# Patient Record
Sex: Male | Born: 1955 | Race: White | Hispanic: No | Smoking: Never smoker
Health system: Southern US, Community
[De-identification: ages and names within clinical notes are randomized; demographics above are authoritative.]

## PROBLEM LIST (undated history)

## (undated) DIAGNOSIS — M1990 Unspecified osteoarthritis, unspecified site: Secondary | ICD-10-CM

## (undated) DIAGNOSIS — R001 Bradycardia, unspecified: Secondary | ICD-10-CM

## (undated) DIAGNOSIS — I4891 Unspecified atrial fibrillation: Secondary | ICD-10-CM

## (undated) DIAGNOSIS — I10 Essential (primary) hypertension: Secondary | ICD-10-CM

## (undated) DIAGNOSIS — F329 Major depressive disorder, single episode, unspecified: Secondary | ICD-10-CM

## (undated) DIAGNOSIS — E785 Hyperlipidemia, unspecified: Secondary | ICD-10-CM

## (undated) DIAGNOSIS — G8929 Other chronic pain: Secondary | ICD-10-CM

## (undated) DIAGNOSIS — M549 Dorsalgia, unspecified: Secondary | ICD-10-CM

## (undated) DIAGNOSIS — R5383 Other fatigue: Secondary | ICD-10-CM

## (undated) DIAGNOSIS — F32A Depression, unspecified: Secondary | ICD-10-CM

## (undated) DIAGNOSIS — I2510 Atherosclerotic heart disease of native coronary artery without angina pectoris: Secondary | ICD-10-CM

## (undated) DIAGNOSIS — N529 Male erectile dysfunction, unspecified: Secondary | ICD-10-CM

## (undated) HISTORY — DX: Atherosclerotic heart disease of native coronary artery without angina pectoris: I25.10

## (undated) HISTORY — DX: Unspecified atrial fibrillation: I48.91

## (undated) HISTORY — PX: JOINT REPLACEMENT: SHX530

## (undated) HISTORY — DX: Other fatigue: R53.83

## (undated) HISTORY — DX: Hyperlipidemia, unspecified: E78.5

## (undated) HISTORY — DX: Unspecified osteoarthritis, unspecified site: M19.90

---

## 2001-07-09 ENCOUNTER — Encounter: Admission: RE | Admit: 2001-07-09 | Discharge: 2001-07-09 | Payer: Self-pay | Admitting: *Deleted

## 2001-07-09 ENCOUNTER — Encounter: Payer: Self-pay | Admitting: *Deleted

## 2002-03-22 ENCOUNTER — Emergency Department (HOSPITAL_COMMUNITY): Admission: EM | Admit: 2002-03-22 | Discharge: 2002-03-22 | Payer: Self-pay | Admitting: Emergency Medicine

## 2002-03-22 ENCOUNTER — Encounter: Payer: Self-pay | Admitting: Emergency Medicine

## 2002-03-31 ENCOUNTER — Ambulatory Visit (HOSPITAL_BASED_OUTPATIENT_CLINIC_OR_DEPARTMENT_OTHER): Admission: RE | Admit: 2002-03-31 | Discharge: 2002-03-31 | Payer: Self-pay | Admitting: Urology

## 2005-12-24 HISTORY — PX: TOTAL HIP ARTHROPLASTY: SHX124

## 2006-01-08 ENCOUNTER — Inpatient Hospital Stay (HOSPITAL_COMMUNITY): Admission: RE | Admit: 2006-01-08 | Discharge: 2006-01-12 | Payer: Self-pay | Admitting: Orthopedic Surgery

## 2006-01-28 ENCOUNTER — Encounter: Payer: Self-pay | Admitting: Orthopedic Surgery

## 2006-02-23 ENCOUNTER — Encounter: Payer: Self-pay | Admitting: Orthopedic Surgery

## 2006-12-24 ENCOUNTER — Emergency Department: Payer: Self-pay | Admitting: Emergency Medicine

## 2008-04-18 HISTORY — PX: CARDIAC CATHETERIZATION: SHX172

## 2008-04-19 HISTORY — PX: CORONARY ARTERY BYPASS GRAFT: SHX141

## 2008-05-10 ENCOUNTER — Encounter: Payer: Self-pay | Admitting: Internal Medicine

## 2008-05-26 ENCOUNTER — Encounter: Payer: Self-pay | Admitting: Internal Medicine

## 2008-06-17 ENCOUNTER — Inpatient Hospital Stay: Payer: Self-pay | Admitting: Internal Medicine

## 2008-06-26 ENCOUNTER — Encounter: Payer: Self-pay | Admitting: Internal Medicine

## 2009-10-08 ENCOUNTER — Encounter: Payer: Self-pay | Admitting: Pulmonary Disease

## 2009-11-27 ENCOUNTER — Encounter: Payer: Self-pay | Admitting: Pulmonary Disease

## 2009-12-14 DIAGNOSIS — M1990 Unspecified osteoarthritis, unspecified site: Secondary | ICD-10-CM | POA: Insufficient documentation

## 2009-12-17 ENCOUNTER — Ambulatory Visit: Payer: Self-pay | Admitting: Pulmonary Disease

## 2009-12-17 DIAGNOSIS — R5383 Other fatigue: Secondary | ICD-10-CM

## 2009-12-17 DIAGNOSIS — Z96649 Presence of unspecified artificial hip joint: Secondary | ICD-10-CM | POA: Insufficient documentation

## 2009-12-17 DIAGNOSIS — R5381 Other malaise: Secondary | ICD-10-CM | POA: Insufficient documentation

## 2009-12-18 ENCOUNTER — Telehealth (INDEPENDENT_AMBULATORY_CARE_PROVIDER_SITE_OTHER): Payer: Self-pay | Admitting: *Deleted

## 2009-12-26 ENCOUNTER — Encounter: Payer: Self-pay | Admitting: Pulmonary Disease

## 2010-02-11 ENCOUNTER — Telehealth: Payer: Self-pay | Admitting: Pulmonary Disease

## 2010-06-25 NOTE — Assessment & Plan Note (Signed)
Summary: sleep consult/apc   Visit Type:  Initial Consult Copy to:  Dr. Deborah Chalk Primary Provider/Referring Provider:  Guerry Bruin, MD  CC:  Sleep consult.  History of Present Illness: 54/M for evaluation of excessive daytime fatigue. He feels that he does not sleep long enough, sometimes laying for a long time, tired, no energy. Epworth Sleepiness Score is 11/24. he naps x 30 mins during lunchtime & after work. Weekend naps are refreshing. Wife has not witnessed apneas or complained about his snoring - she has noted whole body 'quivering' in his sleep. bedtime is 8 30 p, latency 15 mins, 4-5 spontaneous awakenings, sleep son his side with 2 pillows, wakes up at 5A feeling refreshed, no dryness. he drinks 4-5 glasses of tea & 2-3 cans of soda daily. There is no history suggestive of cataplexy, sleep paralysis or parasomnias . Cardiac evaluation by D rTennant incl treadmill stress test was nml. I am not sure if he has had thyroid studies done with his PMD>  Preventive Screening-Counseling & Management  Alcohol-Tobacco     Smoking Status: never   History of Present Illness: Don't sleep long at a time without waking up  What time do you typically go to bed?(between what hours): 8:30pm  How long does it take you to fall asleep? not long maybe 15 minutes  How many times during the night do you wake up? 4-5 times a night  What time do you get out of bed to start your day? 5:00am  Do you drive or operate heavy machinery in your occupation? no  How much has your weight changed (up or down) over the past two years? (in pounds): none  Have you ever had a sleep study before?  If yes,when and where: no  Do you currently use CPAP ? If so , at what pressure? no  Do you wear oxygen at any time? If yes, how many liters per minute? no Allergies (verified): No Known Drug Allergies  Past History:  Past Surgical History: Heart bypass-Apr 18, 2008 HIP REPLACEMENT, LEFT, HX OF  (ICD-V43.64) OSTEOARTHRITIS (ICD-715.90) Left elbow surgery  Family History: Pulmonary edema--mother Heart disease-brother  Social History: Married Non-Smoker Occupation--Works in heating and air-conditioning Patient never smoked.  Smoking Status:  never  Review of Systems       The patient complains of shortness of breath with activity, depression, and joint stiffness or pain.  The patient denies shortness of breath at rest, productive cough, non-productive cough, coughing up blood, chest pain, irregular heartbeats, acid heartburn, indigestion, loss of appetite, weight change, abdominal pain, difficulty swallowing, sore throat, tooth/dental problems, headaches, nasal congestion/difficulty breathing through nose, sneezing, itching, ear ache, anxiety, hand/feet swelling, rash, change in color of mucus, and fever.    Vital Signs:  Patient profile:   55 year old male Height:      68 inches Weight:      194.8 pounds BMI:     29.73 O2 Sat:      93 % on Room air Temp:     98.3 degrees F oral Pulse rate:   72 / minute BP sitting:   130 / 78  (left arm) Cuff size:   regular  Vitals Entered By: Zackery Barefoot CMA (December 17, 2009 2:59 PM)  O2 Flow:  Room air CC: Sleep consult Comments Medications reviewed with patient Verified contact number and pharmacy with patient Zackery Barefoot CMA  December 17, 2009 3:03 PM    Physical Exam  Additional Exam:  Gen. Pleasant, well-nourished,  in no distress, normal affect ENT - no lesions, no post nasal drip, class 2 airway Neck: No JVD, no thyromegaly, no carotid bruits Lungs: no use of accessory muscles, no dullness to percussion, clear without rales or rhonchi  Cardiovascular: Rhythm regular, heart sounds  normal, no murmurs or gallops, no peripheral edema Abdomen: soft and non-tender, no hepatosplenomegaly, BS normal. Musculoskeletal: No deformities, no cyanosis or clubbing Neuro:  alert, non focal     Impression &  Recommendations:  Problem # 1:  FATIGUE (ICD-780.79) Given no clear explanation for fatigue, it is resaonable to look at sleep architecture & see if there is sleep disordered beathing or leg movements hampering sleep quality. The pathophysiology of obstructive sleep apnea, it's cardiovascular consequences and modes of treatment including CPAP were discussed with the patient in great detail.   Orders: Sleep Disorder Referral (Sleep Disorder) Consultation Level III (09811)  Medications Added to Medication List This Visit: 1)  Aspirin 325 Mg Tabs (Aspirin) .... Take 1 tablet by mouth every morning  Patient Instructions: 1)  Please schedule a follow-up appointment in 2 weeks after study 2)  Obtain blood work from dr [employer] History:  Influenza Immunization History:    Influenza:  historical (02/26/2009)     Appended Document: sleep consult/apc d/w Dr Zoila Shutter (peer to peer) - pl let him know that insurance company will not approve sleep study  Appended Document: sleep consult/apc Left several messages on cell phone # that sleep study had been denied by his ins. company. Pt also got several copies of the denial letter from ins. Dr. Vassie Loll did peer to peer, however, still denied. Sleep Study cancelled at the lab. Left message once more today on pt's cell phone that study has been cancelled due to ins. denying service.

## 2010-06-25 NOTE — Progress Notes (Signed)
  Phone Note Other Incoming   Request: Send information Summary of Call: Records received from [facility]. 7 pages forwarded to Dr. Vassie Loll for review.

## 2010-06-25 NOTE — Progress Notes (Signed)
Summary: nos appt  Phone Note Call from Patient   Caller: [email or web address]  Call For: alva Summary of Call: LMTCB x2 to rsc nos from 9/16. Initial call taken by: [name],  February 11, 2010 4:00 PM

## 2010-06-25 NOTE — Letter (Signed)
Summary: [facility]  [facility]   Imported By: Sherian Rein 12/21/2009 13:21:04  _____________________________________________________________________  External Attachment:    Type:   Image     Comment:   External Document

## 2010-09-28 ENCOUNTER — Ambulatory Visit: Payer: Self-pay | Admitting: Family Medicine

## 2010-10-11 NOTE — Op Note (Signed)
Jared [name], Jared [name]                   ACCOUNT NO.:  [id number]   MEDICAL RECORD NO.:  [id number]          PATIENT TYPE:  INP   LOCATION:  2899                         FACILITY:  [facility]   PHYSICIAN:  Nadara Mustard, MD     DATE OF BIRTH:  1956/02/28   DATE OF PROCEDURE:  01/08/2006  DATE OF DISCHARGE:                                 OPERATIVE REPORT   PREOPERATIVE DIAGNOSIS:  Osteoarthritis of left hip, status post femoral  neck fracture.   POSTOPERATIVE DIAGNOSIS:  Osteoarthritis of left hip, status post femoral  neck fracture.   PROCEDURE:  1. Removal of retained hardware, Knowles pin.  2. Left total hip arthroplasty with DePuy components, metal-on-metal      implants, with 51-mm head, 58-mm acetabulum, +2 neck with #12 stem.   SURGEON:  Nadara Mustard, MD   ANESTHESIA:  General.   ESTIMATED BLOOD LOSS:  Minimal.   ANTIBIOTICS:  One gram of Kefzol.   DRAINS:  None.   COMPLICATIONS:  None.   DISPOSITION:  To PACU in stable condition.   INDICATION FOR PROCEDURE:  The patient is a 55 year old gentleman who is  status post a femoral neck fracture for which he underwent percutaneous pin  fixation.  The patient developed progressive osteoarthritis and is unable to  perform activities of daily living due to left hip pain and presents at this  time for total hip arthroplasty.  Risks and benefits were discussed  including infection, neurovascular injury, persistent pain, dislocation,  DVT, pulmonary, need for additional surgery and fracture of the bone.  The  patient states he understands and wishes to proceed at this time.   DESCRIPTION OF PROCEDURE:  The patient was brought to OR room #1 and  underwent a general anesthetic.  After adequate level of anesthesia was  obtained, the patient was placed in the right lateral decubitus position  with the left side up and the left lower extremity was prepped using  DuraPrep, draped into a sterile field and Ioban was used to cover all  exposed skin.  A posterolateral incision was made; this was carried down to  the tensor fascia lata, which was split.  The Knowles pin was identified and  removed with the box wrench.  Attention was then focused on the femur.  The  piriformis and short external rotators were tagged, cut and retracted.  The  capsule was incised longitudinally off the neck and a suture was placed to  repair the capsule.  The head was dislocated and the femoral neck cut was  made 1 cm proximal to the calcar in line with the template.  Attention was  then focused on the acetabulum.  The large osteophytic bone spurs were  removed from the acetabulum.  The patient was noted to have large loose  bodies within the acetabulum, which were also removed.  The acetabular was  sequentially reamed to 57 and a 58-mm acetabulum was then inserted with 45  degrees of abduction and approximately 20 degrees of anteversion.  The  attention was then focused on the femur.  The  femur was sequentially  broached to a size 12.  The hip was reduced.  The distal aspect of the  gluteus maximus had to be released to facilitate reduction due to the  chronic shortened nature of the hip.  The anterior aspect of the capsule was  also released.  The hip was reduced.  There was full flexion and full  abduction, internal rotation of greater than 70 degrees and the hip was  stable.  He had full extension and external rotation with no instability.  The trial components were removed.  The final #12 stem was inserted with the  51-mm head.  This was then reduced, again placed through a full range of  motion and was stable.  The wound was irrigated with normal saline  throughout the case.  The capsule, piriformis and short external rotators  were reapproximated using #1 Ethibond.  The sciatic nerve was protected  throughout the case and there were 2 branches of the sciatic nerve within  the operative field and these were evaluated both pre and  postoperatively  and these were both intact.  There was no tension on the sciatic nerve.  There was no injury and no hematoma.  The tensor fascia lata was then closed  using a #1 Vicryl.  Subcu was closed in 2-0 Vicryl.  The skin was closed  using approximating staples.  The leg length appeared equal.  The wound was  covered Adaptic, orthopedic sponges, ABD dressing and Hypafix tape.  The  patient was placed in a knee immobilizer, extubated and taken to the PACU in  stable condition.  He did have an active dorsiflexion of the ankle and toes.   Plan for hospitalization with discharge to home.      Nadara Mustard, MD  Electronically Signed     MVD/MEDQ  D:  01/08/2006  T:  01/08/2006  Job:  [id number]

## 2010-10-11 NOTE — Discharge Summary (Signed)
NAMESIMON, LLAMAS                   ACCOUNT NO.:  [id number]   MEDICAL RECORD NO.:  [id number]          PATIENT TYPE:  INP   LOCATION:  5033                         FACILITY:  [facility]   PHYSICIAN:  Nadara Mustard, MD     DATE OF BIRTH:  Dec 01, 1955   DATE OF ADMISSION:  01/08/2006  DATE OF DISCHARGE:  01/12/2006                                 DISCHARGE SUMMARY   FINAL DIAGNOSES:  1. Osteoarthritis left hip status post femoral neck fracture.   PROCEDURES:  1. Removal of retained hardware.  2. Left total hip arthroplasty.  Discharged to home in stable condition.      Plan to follow-up in 2 weeks.  Prescription for doxycycline, Coumadin      and Tylox, as well as for home health therapy.   HISTORY OF PRESENT ILLNESS:  The patient is a 55 year old gentleman who fell  in the 1980s sustaining an open reduction internal fixation for a femoral  neck fracture.  The patient has developed progressive arthritis, and despite  conservative care, he is unable to perform activities of daily living and  wishes to proceed with total hip arthroplasty at this time.  The patient  underwent a total hip arthroplasty on August 16 with removal of retained  hardware and placement of a metal on metal total hip arthroplasty.  He  received a 51 mm head 58 mm acetabulum +2 neck size 12 stem.  He received  Kefzol for infection prophylaxis and Coumadin for DVT prophylaxis.  The  patient progressed well with physical therapy.  He had some mild redness  around the incision and was started on doxycycline.  He was discharged to  home in stable condition on August 20 with followup in the office in 2 weeks  with home health physical therapy and prescription for doxycycline, Coumadin  and Tylox.      Nadara Mustard, MD  Electronically Signed     MVD/MEDQ  D:  01/28/2006  T:  01/28/2006  Job:  [id number]

## 2010-10-31 ENCOUNTER — Encounter: Payer: Self-pay | Admitting: *Deleted

## 2010-11-04 ENCOUNTER — Ambulatory Visit (INDEPENDENT_AMBULATORY_CARE_PROVIDER_SITE_OTHER): Payer: No Typology Code available for payment source | Admitting: Cardiology

## 2010-11-04 ENCOUNTER — Encounter: Payer: Self-pay | Admitting: Cardiology

## 2010-11-04 VITALS — BP 116/58 | HR 60 | Ht 68.0 in | Wt 196.5 lb

## 2010-11-04 DIAGNOSIS — I2510 Atherosclerotic heart disease of native coronary artery without angina pectoris: Secondary | ICD-10-CM | POA: Insufficient documentation

## 2010-11-04 NOTE — Progress Notes (Signed)
Subjective:   Jared [name] is seen back today for followup visit. In general, he's feeling well except for lower extremity aching and weakness. He doesn't have much in way of low back pain. He has had a total hip replacement that is currently under recall. He did not have a sleep study. He complains of aching and discomfort in his lower extremities. I suggested that we try to stop his simvastatin for 6 weeks and then come and see Jared [name] for a followup visit, lipids, and appropriate course of action dependent upon clinical findings.  Current Outpatient Prescriptions  Medication Sig Dispense Refill  . aspirin 81 MG tablet Take 325 mg by mouth daily.       . clonazePAM (KLONOPIN) 0.5 MG tablet Take 0.5 mg by mouth as needed.        . metoprolol (LOPRESSOR) 50 MG tablet Take 50 mg by mouth 2 (two) times daily.        . sertraline (ZOLOFT) 50 MG tablet Take 50 mg by mouth daily.        . simvastatin (ZOCOR) 40 MG tablet Take 40 mg by mouth at bedtime.          No Known Allergies  Patient Active Problem List  Diagnoses  . OSTEOARTHRITIS  . FATIGUE  . HIP REPLACEMENT, LEFT, HX OF    History  Smoking status  . Never Smoker   Smokeless tobacco  . Never Used    History  Alcohol Use No    Family History  Problem Relation Age of Onset  . Edema Mother     pulmonary edema    Review of Systems:   The patient denies any heat or cold intolerance.  No weight gain or weight loss.  The patient denies headaches or blurry vision.  There is no cough or sputum production.  The patient denies dizziness.  There is no hematuria or hematochezia.  The patient denies any muscle aches or arthritis.  The patient denies any rash.  The patient denies frequent falling or instability.  There is no history of depression or anxiety.  All other systems were reviewed and are negative.   Physical Exam:   Weight is 196. Blood pressure 116/58 sitting, heart rate 60.The head is normocephalic and atraumatic.  Pupils are  equally round and reactive to light.  Sclerae nonicteric.  Conjunctiva is clear.  Oropharynx is unremarkable.  There's adequate oral airway.  Neck is supple there are no masses.  Thyroid is not enlarged.  There is no lymphadenopathy.  Lungs are clear.  Chest is symmetric.  Heart shows a regular rate and rhythm.  S1 and S2 are normal.  There is no murmur click or gallop.  Abdomen is soft normal bowel sounds.  There is no organomegaly.  Genital and rectal deferred.  Extremities are without edema.  Peripheral pulses are adequate.  Neurologically intact.  Full range of motion.  The patient is not depressed.  Skin is warm and dry.  He does have good pedal pulse Assessment / Plan:

## 2010-11-04 NOTE — Assessment & Plan Note (Signed)
He had coronary artery bypass grafting on April 18, 2008 at [facility] in [place]. He had a left internal mammary artery to the LAD and a saphenous vein graft to diagonal and has done well since that time. He remains on low-dose beta blockers and doesn't have any side effects. He continues taking aspirin. He is on simvastatin and may be having myalgias and we will give him a drug holiday. He will see Lawson Fiscal in 6 weeks.

## 2010-11-08 ENCOUNTER — Other Ambulatory Visit: Payer: Self-pay | Admitting: Cardiology

## 2010-12-03 ENCOUNTER — Other Ambulatory Visit: Payer: Self-pay | Admitting: *Deleted

## 2010-12-03 DIAGNOSIS — E785 Hyperlipidemia, unspecified: Secondary | ICD-10-CM

## 2010-12-13 ENCOUNTER — Encounter: Payer: Self-pay | Admitting: Cardiology

## 2010-12-16 ENCOUNTER — Other Ambulatory Visit: Payer: No Typology Code available for payment source | Admitting: *Deleted

## 2010-12-16 ENCOUNTER — Ambulatory Visit: Payer: No Typology Code available for payment source | Admitting: Nurse Practitioner

## 2010-12-26 ENCOUNTER — Other Ambulatory Visit (INDEPENDENT_AMBULATORY_CARE_PROVIDER_SITE_OTHER): Payer: No Typology Code available for payment source | Admitting: *Deleted

## 2010-12-26 ENCOUNTER — Encounter: Payer: Self-pay | Admitting: Nurse Practitioner

## 2010-12-26 ENCOUNTER — Ambulatory Visit (INDEPENDENT_AMBULATORY_CARE_PROVIDER_SITE_OTHER): Payer: No Typology Code available for payment source | Admitting: Nurse Practitioner

## 2010-12-26 ENCOUNTER — Other Ambulatory Visit: Payer: Self-pay | Admitting: Nurse Practitioner

## 2010-12-26 VITALS — BP 132/90 | HR 56 | Ht 68.0 in | Wt 199.4 lb

## 2010-12-26 DIAGNOSIS — I2510 Atherosclerotic heart disease of native coronary artery without angina pectoris: Secondary | ICD-10-CM

## 2010-12-26 DIAGNOSIS — E785 Hyperlipidemia, unspecified: Secondary | ICD-10-CM

## 2010-12-26 LAB — BASIC METABOLIC PANEL
BUN: 19 mg/dL (ref 6–23)
CO2: 26 mEq/L (ref 19–32)
Calcium: 9.2 mg/dL (ref 8.4–10.5)
Chloride: 104 mEq/L (ref 96–112)
Creatinine, Ser: 0.9 mg/dL (ref 0.4–1.5)
GFR: 98.73 mL/min (ref >60.00)
Glucose, Bld: 116 mg/dL — ABNORMAL HIGH (ref 70–99)
Potassium: 4.8 mEq/L (ref 3.5–5.1)
Sodium: 142 mEq/L (ref 135–145)

## 2010-12-26 LAB — HEPATIC FUNCTION PANEL
ALT: 22 U/L (ref 0–53)
AST: 22 U/L (ref 0–37)
Albumin: 4.7 g/dL (ref 3.5–5.2)
Alkaline Phosphatase: 56 U/L (ref 39–117)
Bilirubin, Direct: 0.1 mg/dL (ref 0.0–0.3)
Total Bilirubin: 0.7 mg/dL (ref 0.3–1.2)
Total Protein: 7.9 g/dL (ref 6.0–8.3)

## 2010-12-26 LAB — LDL CHOLESTEROL, DIRECT: Direct LDL: 95.1 mg/dL

## 2010-12-26 LAB — LIPID PANEL
Cholesterol: 194 mg/dL (ref 0–200)
HDL: 59.7 mg/dL (ref >39.00)
Total CHOL/HDL Ratio: 3
Triglycerides: 217 mg/dL — ABNORMAL HIGH (ref 0.0–149.0)
VLDL: 43.4 mg/dL — ABNORMAL HIGH (ref 0.0–40.0)

## 2010-12-26 MED ORDER — ROSUVASTATIN CALCIUM 5 MG PO TABS: 5.0000 mg | ORAL_TABLET | Freq: Every day | ORAL | Status: DC

## 2010-12-26 NOTE — Assessment & Plan Note (Signed)
Now on Crestor 5 mg. Samples and coupon card given today. Will check labs today.

## 2010-12-26 NOTE — Patient Instructions (Signed)
We will leave you on your current medicines We are going to check your cholesterol levels today We will see you in 6 months. You will see Dr. Delane Ginger at that time. Call for any problems.

## 2010-12-26 NOTE — Progress Notes (Signed)
Jared [name] Date of Birth: 1955-07-05   History of Present Illness: Jared [name] is seen today for a 6 week check. He is seen for Dr. Elease Hashimoto. He is a former patient of Dr. Ronnald Nian. He has had prior CABG. He is doing well with no chest pain. The heat has been hard on him. He works IT consultant units. He is staying active. Blood pressure is lower at home. He has not had his medicines today. His Zocor was stopped due to myalgias. He is now on Crestor and feels ok. He will need labs today.   Current Outpatient Prescriptions on File Prior to Visit  Medication Sig Dispense Refill  . clonazePAM (KLONOPIN) 0.5 MG tablet Take 0.5 mg by mouth as needed.        . fish oil-omega-3 fatty acids 1000 MG capsule Take 2 g by mouth daily.        . metoprolol (LOPRESSOR) 50 MG tablet TAKE 1 TABLET BY MOUTH TWICE A DAY  60 tablet  6  . naproxen sodium (ANAPROX) 220 MG tablet Take 220 mg by mouth 2 (two) times daily with a meal.        . rosuvastatin (CRESTOR) 5 MG tablet Take 1 tablet (5 mg total) by mouth daily.  90 tablet  3  . sertraline (ZOLOFT) 50 MG tablet Take 50 mg by mouth daily.        Marland Kitchen DISCONTD: aspirin 81 MG tablet Take 325 mg by mouth daily.         Allergies  Allergen Reactions  . Zocor (Simvastatin)     myalgias    Past Medical History  Diagnosis Date  . Coronary artery disease     CABG -- x2 -- 04/19/2008 -- EF of 70%left internal mammary artery to the LAD and a saphenous vein graft to the first diagonal with apparent intermediate coronary that was felt to be too small to bypass -- By Dr. Leotis Pain  . Fatigue   . Hyperlipidemia   . Hypercholesteremia     Myalgias with Zocor  . Osteoarthritis     Past Surgical History  Procedure Date  . Coronary artery bypass graft 04/19/2008    x2 -- EF of 70% -- left internal mammary artery to the LAD and a saphenous vein graft to the first diagonal with apparent intermediate coronary that was felt to be too small to bypass  .  Cardiac catheterization 04/18/2008    severe stenosis in the left anterior descending and what was described at that point as the first and second diagonal vessels.  The right coronary artery was nondominant .  The left circumfles had what was felt to be moderate sesions in its midportions  . Total hip arthroplasty     History  Smoking status  . Never Smoker   Smokeless tobacco  . Never Used    History  Alcohol Use No    Family History  Problem Relation Age of Onset  . Edema Mother     pulmonary edema    Review of Systems: The review of systems is positive for mild fatigue.  All other systems were reviewed and are negative.  Physical Exam: BP 132/90  Pulse 56  Ht 5\' 8"  (1.727 m)  Wt 199 lb 6.4 oz (90.447 kg)  BMI 30.32 kg/m2 (no meds today) Patient is very pleasant and in no acute distress. Skin is warm and dry. Color is normal.  HEENT is unremarkable. Normocephalic/atraumatic. PERRL. Sclera are  nonicteric. Neck is supple. No masses. No JVD. Lungs are clear. Cardiac exam shows a regular rate and rhythm. Abdomen is soft. Extremities are without edema. Gait and ROM are intact. No gross neurologic deficits noted.  LABORATORY DATA:   Assessment / Plan:

## 2010-12-26 NOTE — Assessment & Plan Note (Signed)
Prior CABG in 2009. Doing well from our standpoint. He will see Dr. Elease Hashimoto on return in 6 months. Will consider nuclear study on return. Patient is agreeable to this plan and will call if any problems develop in the interim.

## 2010-12-27 NOTE — Progress Notes (Signed)
Pt called, msg to call bvack with questions

## 2010-12-27 NOTE — Progress Notes (Signed)
Patient called with lab results. msg left to call for questions,

## 2011-08-22 ENCOUNTER — Other Ambulatory Visit: Payer: Self-pay | Admitting: Nurse Practitioner

## 2012-01-03 ENCOUNTER — Other Ambulatory Visit: Payer: Self-pay | Admitting: Nurse Practitioner

## 2012-01-31 ENCOUNTER — Other Ambulatory Visit: Payer: Self-pay | Admitting: Nurse Practitioner

## 2012-06-03 ENCOUNTER — Other Ambulatory Visit: Payer: Self-pay | Admitting: Nurse Practitioner

## 2012-07-07 ENCOUNTER — Other Ambulatory Visit: Payer: Self-pay | Admitting: Nurse Practitioner

## 2012-08-12 ENCOUNTER — Other Ambulatory Visit: Payer: Self-pay | Admitting: Nurse Practitioner

## 2012-08-22 ENCOUNTER — Other Ambulatory Visit: Payer: Self-pay | Admitting: Nurse Practitioner

## 2012-08-30 ENCOUNTER — Other Ambulatory Visit: Payer: Self-pay | Admitting: Nurse Practitioner

## 2012-10-09 ENCOUNTER — Other Ambulatory Visit: Payer: Self-pay | Admitting: Nurse Practitioner

## 2012-10-22 ENCOUNTER — Ambulatory Visit: Payer: No Typology Code available for payment source | Admitting: Nurse Practitioner

## 2012-11-23 ENCOUNTER — Encounter: Payer: Self-pay | Admitting: Nurse Practitioner

## 2012-11-23 ENCOUNTER — Ambulatory Visit (INDEPENDENT_AMBULATORY_CARE_PROVIDER_SITE_OTHER): Payer: BC Managed Care – PPO | Admitting: Nurse Practitioner

## 2012-11-23 VITALS — BP 130/76 | HR 60 | Ht 67.0 in | Wt 204.8 lb

## 2012-11-23 DIAGNOSIS — I2510 Atherosclerotic heart disease of native coronary artery without angina pectoris: Secondary | ICD-10-CM

## 2012-11-23 MED ORDER — ROSUVASTATIN CALCIUM 5 MG PO TABS: 5.0000 mg | ORAL_TABLET | Freq: Every day | ORAL | Status: DC

## 2012-11-23 NOTE — Progress Notes (Signed)
Decklan R Hilliker Date of Birth: 1956/03/07 Medical Record #[id number]  History of Present Illness: Mr. Westbrooks is seen today for a follow up visit. He is seen for Dr. Elease Hashimoto. He is a former patient of DR. Tennant's. Has known CAD with prior CABG in 2009 with LIMA to LAD and SVG to DX. The intermediate was felt to be too small to bypass. Other issues include HTN and HLD.   Last seen almost 2 years ago - he was doing well.   Comes in today. He is here with his wife. Doing well. 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 No chest pain. Not short of breath. Not dizzy or lightheaded. Tries to stay active. Feels ok on his medicines. Sees Dr. Wylene Simmer for his primary care and has a physical later this summer with labs. No complaints. Needs Crestor refilled.   Current Outpatient Prescriptions  Medication Sig Dispense Refill  . aspirin 325 MG tablet Take 325 mg by mouth daily.        Marland Kitchen CIALIS 5 MG tablet Take 5 mg by mouth as needed.       . clonazePAM (KLONOPIN) 0.5 MG tablet Take 0.5 mg by mouth as needed.        . fish oil-omega-3 fatty acids 1000 MG capsule Take 2 g by mouth daily.        . metoprolol (LOPRESSOR) 50 MG tablet TAKE 1 TABLET BY MOUTH TWICE A DAY  60 tablet  4  . naproxen sodium (ANAPROX) 220 MG tablet Take 220 mg by mouth 2 (two) times daily with a meal.        . rosuvastatin (CRESTOR) 5 MG tablet Take 1 tablet (5 mg total) by mouth daily.  30 tablet  11  . sertraline (ZOLOFT) 50 MG tablet Take 50 mg by mouth daily.         No current facility-administered medications for this visit.    Allergies  Allergen Reactions  . Zocor (Simvastatin)     myalgias    Past Medical History  Diagnosis Date  . Coronary artery disease     CABG -- x2 -- 04/19/2008 -- EF of 70%left internal mammary artery to the LAD and a saphenous vein graft to the first diagonal with apparent intermediate coronary that was felt to be too small to bypass -- By Dr. Leotis Pain  . Fatigue   .  Hyperlipidemia   . Hypercholesteremia     Myalgias with Zocor  . Osteoarthritis     Past Surgical History  Procedure Laterality Date  . Coronary artery bypass graft  04/19/2008    x2 -- EF of 70% -- left internal mammary artery to the LAD and a saphenous vein graft to the first diagonal with apparent intermediate coronary that was felt to be too small to bypass  . Cardiac catheterization  04/18/2008    severe stenosis in the left anterior descending and what was described at that point as the first and second diagonal vessels.  The right coronary artery was nondominant .  The left circumfles had what was felt to be moderate sesions in its midportions  . Total hip arthroplasty      History  Smoking status  . Never Smoker   Smokeless tobacco  . Never Used    History  Alcohol Use No    Family History  Problem Relation Age of Onset  . Edema Mother     pulmonary edema    Review of Systems: The  review of systems is per the HPI.  All other systems were reviewed and are negative.  Physical Exam: BP 130/76  Pulse 60  Ht 5\' 7"  (1.702 m)  Wt 204 lb 12.8 oz (92.897 kg)  BMI 32.07 kg/m2 Patient is very pleasant and in no acute distress. Skin is warm and dry. Color is normal.  HEENT is unremarkable. Normocephalic/atraumatic. PERRL. Sclera are nonicteric. Neck is supple. No masses. No JVD. Lungs are clear. Cardiac exam shows a regular rate and rhythm. Abdomen is soft. Extremities are without edema. Gait and ROM are intact. No gross neurologic deficits noted.  LABORATORY DATA:  Lab Results  Component Value Date   GLUCOSE 116* 12/26/2010   CHOL 194 12/26/2010   TRIG 217.0* 12/26/2010   HDL 59.70 12/26/2010   LDLDIRECT 95.1 12/26/2010   ALT 22 12/26/2010   AST 22 12/26/2010   NA 142 12/26/2010   K 4.8 12/26/2010   CL 104 12/26/2010   CREATININE 0.9 12/26/2010   BUN 19 12/26/2010   CO2 26 12/26/2010     Assessment / Plan: 1. CAD - remote CABG - doing well with no symptoms.  2. HLD - on statin  therapy - labs per PCP - I have refilled his Crestor today.   I will see him back in one year. Continue with CV risk factor modification.   Patient is agreeable to this plan and will call if any problems develop in the interim.   Rosalio Macadamia, RN, ANP-C [facility] [address] [place], [place]  [address]

## 2012-11-23 NOTE — Patient Instructions (Addendum)
I will send Dr. Wylene Simmer a note about you  Stay on your current medicines. I have refilled the Crestor.   I will see you in a year  Stay active!  Call the [facility] office at [phone] if you have any questions, problems or concerns.

## 2013-01-08 ENCOUNTER — Other Ambulatory Visit: Payer: Self-pay | Admitting: Cardiovascular Disease

## 2013-04-19 ENCOUNTER — Emergency Department: Payer: Self-pay | Admitting: Emergency Medicine

## 2013-06-11 ENCOUNTER — Other Ambulatory Visit: Payer: Self-pay | Admitting: Cardiovascular Disease

## 2013-08-21 ENCOUNTER — Other Ambulatory Visit: Payer: Self-pay | Admitting: Cardiovascular Disease

## 2013-12-26 ENCOUNTER — Ambulatory Visit: Payer: BC Managed Care – PPO | Admitting: Nurse Practitioner

## 2013-12-26 ENCOUNTER — Encounter: Payer: Self-pay | Admitting: Nurse Practitioner

## 2013-12-26 ENCOUNTER — Ambulatory Visit (INDEPENDENT_AMBULATORY_CARE_PROVIDER_SITE_OTHER): Payer: BC Managed Care – PPO | Admitting: Nurse Practitioner

## 2013-12-26 VITALS — BP 112/70 | HR 51 | Ht 67.0 in | Wt 206.8 lb

## 2013-12-26 DIAGNOSIS — R001 Bradycardia, unspecified: Secondary | ICD-10-CM

## 2013-12-26 DIAGNOSIS — I259 Chronic ischemic heart disease, unspecified: Secondary | ICD-10-CM

## 2013-12-26 DIAGNOSIS — I498 Other specified cardiac arrhythmias: Secondary | ICD-10-CM

## 2013-12-26 MED ORDER — TADALAFIL 5 MG PO TABS: 5.0000 mg | ORAL_TABLET | ORAL | Status: DC | PRN

## 2013-12-26 MED ORDER — METOPROLOL TARTRATE 50 MG PO TABS: 25.0000 mg | ORAL_TABLET | Freq: Two times a day (BID) | ORAL | Status: DC

## 2013-12-26 NOTE — Patient Instructions (Addendum)
Stay on your current medicines but I am cutting your Metoprolol back to just a half a pill 25mg  twice a day  I have refilled the Metoprolol and the Cialis today  Monitor your blood pressure for me - bring a diary of your readings to your treadmill visit  We will check an EKG today  I will tentatively see you in a year  We will arrange for a treadmill test prior to September 14th  Call the [facility] office at [phone] if you have any questions, problems or concerns.

## 2013-12-26 NOTE — Progress Notes (Addendum)
Jared [name] Date of Birth: 04/17/1956 Medical Record #[id number]  History of Present Illness: Mr. Mantell is seen back today for a one year check. Seen for Dr. Elease Hashimoto. Former patient of Dr. Ronnald Nian. He has known CAD with prior CABG in 2009 with LIMA to LAD and SVG to DX. Intermediate vessel too small to bypass. Other issues include HTN and HLD.  Last seen a year ago - was doing well.  Comes back today. Here alone. Doing ok. No chest pain. Not really exercising. Just had his physical with Dr. Wylene Simmer - that went ok - had complete labs checked. Still endorsing fatigue with exertion. Wants a GXT.  Does not check his BP at home but can. Using Cialis for ED - needs refill. Resting bradycardia noted - not dizzy or lightheaded. No passing out spells noted. On chronic narcotics for back/neck pain. Planning on hip surgery in mid September.   Current Outpatient Prescriptions  Medication Sig Dispense Refill  . aspirin 325 MG tablet Take 325 mg by mouth daily.        Marland Kitchen CIALIS 5 MG tablet Take 5 mg by mouth as needed.       Marland Kitchen HYDROcodone-acetaminophen (NORCO/VICODIN) 5-325 MG per tablet Take 1 tablet by mouth as needed for moderate pain.      . metoprolol (LOPRESSOR) 50 MG tablet TAKE 1 TABLET BY MOUTH TWICE A DAY  60 tablet  4  . rosuvastatin (CRESTOR) 5 MG tablet Take 1 tablet (5 mg total) by mouth daily.  30 tablet  11  . sertraline (ZOLOFT) 50 MG tablet Take 50 mg by mouth daily.         No current facility-administered medications for this visit.    Allergies  Allergen Reactions  . Zocor [Simvastatin]     myalgias    Past Medical History  Diagnosis Date  . Coronary artery disease     CABG -- x2 -- 04/19/2008 -- EF of 70%left internal mammary artery to the LAD and a saphenous vein graft to the first diagonal with apparent intermediate coronary that was felt to be too small to bypass -- By Dr. Leotis Pain  . Fatigue   . Hyperlipidemia   . Hypercholesteremia     Myalgias with Zocor  .  Osteoarthritis     Past Surgical History  Procedure Laterality Date  . Coronary artery bypass graft  04/19/2008    x2 -- EF of 70% -- left internal mammary artery to the LAD and a saphenous vein graft to the first diagonal with apparent intermediate coronary that was felt to be too small to bypass  . Cardiac catheterization  04/18/2008    severe stenosis in the left anterior descending and what was described at that point as the first and second diagonal vessels.  The right coronary artery was nondominant .  The left circumfles had what was felt to be moderate sesions in its midportions  . Total hip arthroplasty      History  Smoking status  . Never Smoker   Smokeless tobacco  . Never Used    History  Alcohol Use No    Family History  Problem Relation Age of Onset  . Edema Mother     pulmonary edema    Review of Systems: The review of systems is per the HPI.  All other systems were reviewed and are negative.  Physical Exam: BP 112/70  Pulse 51  Ht 5\' 7"  (1.702 m)  Wt 206 lb 12.8 oz (93.804  kg)  BMI 32.38 kg/m2  SpO2 96% Patient is very pleasant and in no acute distress. Skin is warm and dry. Color is normal.  HEENT is unremarkable. Normocephalic/atraumatic. PERRL. Sclera are nonicteric. Neck is supple. No masses. No JVD. Lungs are clear. Cardiac exam shows a regular rate and rhythm. Abdomen is soft. Extremities are without edema. Gait and ROM are intact. No gross neurologic deficits noted.  Wt Readings from Last 3 Encounters:  12/26/13 206 lb 12.8 oz (93.804 kg)  11/23/12 204 lb 12.8 oz (92.897 kg)  12/26/10 199 lb 6.4 oz (90.447 kg)    LABORATORY DATA/PROCEDURES:  Lab Results  Component Value Date   GLUCOSE 116* 12/26/2010   CHOL 194 12/26/2010   TRIG 217.0* 12/26/2010   HDL 59.70 12/26/2010   LDLDIRECT 95.1 12/26/2010   ALT 22 12/26/2010   AST 22 12/26/2010   NA 142 12/26/2010   K 4.8 12/26/2010   CL 104 12/26/2010   CREATININE 0.9 12/26/2010   BUN 19 12/26/2010   CO2 26  12/26/2010    BNP (last 3 results) No results found for this basename: PROBNP,  in the last 8760 hours   Assessment / Plan: 1. CAD - prior CABG back in 2009 - will arrange for GXT - planning on hip surgery in mid September - he feels like he will be able to walk ok. He will most likely need clearance for this - no active cardiac symptoms but will arrange for GXT.   2. HTN - BP is great.  3. HLD -  Recent labs checked by PCP  4. Resting bradycardia - endorses fatigue as well. Checking EKG today. Lopressor cut in half. Will ask him to continue to monitor his BP at home and bring in readings for review at the time of his GXT.  Tentatively see him back in one year.  Patient is agreeable to this plan and will call if any problems develop in the interim.   Rosalio MacadamiaLori C. Inna Tisdell, RN, ANP-C [facility] [address] [place], [place]  [id number] [phone]

## 2014-01-24 ENCOUNTER — Other Ambulatory Visit: Payer: Self-pay

## 2014-01-24 HISTORY — PX: REVISION TOTAL HIP ARTHROPLASTY: SHX766

## 2014-01-24 MED ORDER — ROSUVASTATIN CALCIUM 5 MG PO TABS: 5.0000 mg | ORAL_TABLET | Freq: Every day | ORAL | Status: DC

## 2014-01-25 ENCOUNTER — Telehealth: Payer: Self-pay | Admitting: *Deleted

## 2014-01-25 NOTE — Telephone Encounter (Signed)
Patient called and stated that he needs prior authorization for the crestor. He stated that he received a call about this and he thinks that it was from [facility]. They gave him a reference number of [id number] and the number to call is [phone]. His insurance is with [employer] [place] and his member id is [id number]. Thanks, MI

## 2014-01-26 MED ORDER — ROSUVASTATIN CALCIUM 5 MG PO TABS: 5.0000 mg | ORAL_TABLET | Freq: Every day | ORAL | Status: DC

## 2014-01-26 NOTE — Telephone Encounter (Signed)
Spoke with Almira Coaster at [facility] who states that patient's medication was not ordered for the 90 day supply that his plan allows.  States a pharmacist will contact me to confirm that order can be changed to 90 day supply with 3 refills.  I have updated the order in the patient's record.

## 2014-01-27 ENCOUNTER — Ambulatory Visit (INDEPENDENT_AMBULATORY_CARE_PROVIDER_SITE_OTHER): Payer: BC Managed Care – PPO | Admitting: Physician Assistant

## 2014-01-27 DIAGNOSIS — R9439 Abnormal result of other cardiovascular function study: Secondary | ICD-10-CM

## 2014-01-27 DIAGNOSIS — I259 Chronic ischemic heart disease, unspecified: Secondary | ICD-10-CM

## 2014-01-27 DIAGNOSIS — R001 Bradycardia, unspecified: Secondary | ICD-10-CM

## 2014-01-27 DIAGNOSIS — I498 Other specified cardiac arrhythmias: Secondary | ICD-10-CM

## 2014-01-27 NOTE — Progress Notes (Signed)
Exercise Treadmill Test  Pre-Exercise Testing Evaluation Rhythm: normal sinus  Rate: 67 bpm     Test  Exercise Tolerance Test Ordering MD: Kristeen Miss, MD  Interpreting MD: Tereso Newcomer, PA-C  Unique Test No: 1  Treadmill:  1  Indication for ETT: known ASHD  Contraindication to ETT: No   Stress Modality: exercise - treadmill  Cardiac Imaging Performed: non   Protocol: standard Bruce - maximal  Max BP:  194/67  Max MPHR (bpm):  160 85% MPR (bpm):  136  MPHR obtained (bpm):  144 % MPHR obtained:  90  Reached 85% MPHR (min:sec):  6:17 Total Exercise Time (min-sec):  7:00  Workload in METS:  8.5 Borg Scale: 16  Reason ETT Terminated:  desired heart rate attained    ST Segment Analysis At Rest: normal ST segments - no evidence of significant ST depression With Exercise: significant ischemic ST depression  Other Information Arrhythmia:  No Angina during ETT:  absent (0) Quality of ETT:  diagnostic  ETT Interpretation:  abnormal - evidence of ST depression consistent with ischemia  Comments: Good exercise capacity. No chest pain. Normal BP response to exercise. There was significant inf-lat ST depression at peak exercise.  Recommendations: This was a routine ETT done for surgical clearance. Patient had no chest pain with exercise and good exercise tolerance. He has known CAD s/p CABG. Will arrange [employer]. Signed,  Tereso Newcomer, PA-C   01/27/2014 9:30 AM

## 2014-01-27 NOTE — Patient Instructions (Signed)
Your physician has requested that you have a lexiscan myoview. For further information please visit [email or web address]. Please follow instruction sheet, as given.   

## 2014-02-02 ENCOUNTER — Ambulatory Visit (HOSPITAL_COMMUNITY): Payer: BC Managed Care – PPO | Attending: Physician Assistant | Admitting: Radiology

## 2014-02-02 VITALS — BP 153/90 | HR 60 | Ht 67.0 in | Wt 198.0 lb

## 2014-02-02 DIAGNOSIS — R5383 Other fatigue: Secondary | ICD-10-CM

## 2014-02-02 DIAGNOSIS — Z01810 Encounter for preprocedural cardiovascular examination: Secondary | ICD-10-CM

## 2014-02-02 DIAGNOSIS — Z951 Presence of aortocoronary bypass graft: Secondary | ICD-10-CM | POA: Diagnosis not present

## 2014-02-02 DIAGNOSIS — I2510 Atherosclerotic heart disease of native coronary artery without angina pectoris: Secondary | ICD-10-CM | POA: Insufficient documentation

## 2014-02-02 DIAGNOSIS — I259 Chronic ischemic heart disease, unspecified: Secondary | ICD-10-CM

## 2014-02-02 DIAGNOSIS — R5381 Other malaise: Secondary | ICD-10-CM | POA: Diagnosis not present

## 2014-02-02 DIAGNOSIS — I10 Essential (primary) hypertension: Secondary | ICD-10-CM | POA: Diagnosis not present

## 2014-02-02 DIAGNOSIS — R9439 Abnormal result of other cardiovascular function study: Secondary | ICD-10-CM

## 2014-02-02 DIAGNOSIS — Z01818 Encounter for other preprocedural examination: Secondary | ICD-10-CM | POA: Insufficient documentation

## 2014-02-02 MED ORDER — TECHNETIUM TC 99M SESTAMIBI GENERIC - CARDIOLITE
10.0000 | Freq: Once | INTRAVENOUS | Status: AC | PRN
  Administered 2014-02-02: 10 via INTRAVENOUS

## 2014-02-02 MED ORDER — REGADENOSON 0.4 MG/5ML IV SOLN
0.4000 mg | Freq: Once | INTRAVENOUS | Status: AC
  Administered 2014-02-02: 0.4 mg via INTRAVENOUS

## 2014-02-02 MED ORDER — TECHNETIUM TC 99M SESTAMIBI GENERIC - CARDIOLITE
30.0000 | Freq: Once | INTRAVENOUS | Status: AC | PRN
  Administered 2014-02-02: 30 via INTRAVENOUS

## 2014-02-02 NOTE — Progress Notes (Signed)
[facility] SITE 3 NUCLEAR MED [address] [place], [place] [address] [phone]    Cardiology Nuclear Med [facility] Jared [name] is a 58 y.o. male     MRN : [id number]     DOB: [birth year]  Procedure Date: 02/02/2014  Nuclear Med Background Indication for Stress Test:  Evaluation for Ischemia, Graft Patency, Surgical Clearance for  (L) Hip surgery on 02-06-2014 by Doristine Counter, MD at [facility] in [place], 01-27-2014 Abnormal GXT: ST changes History:  CAD, MPI ~ 6 yrs. ago no report available Cardiac Risk Factors: Hypertension  Symptoms:  Fatigue with Exertion   Nuclear Pre-Procedure Caffeine/Decaff Intake:  None> 12 hrs NPO After: 8:30pm   Lungs:  clear O2 Sat: 96% on room air. IV 0.9% NS with Angio Cath:  22g  IV Site: R Wrist x 1, tolerated well IV Started by:  Jared Hong, RN  Chest Size (in):  44 Cup Size: n/a  Height:  (1.702 m)  Weight:  198 lb (89.812 kg)  BMI:  Body mass index is 31 kg/(m^2). Tech Comments:  Patient held Lopressor x 24 hrs. Jared Hong, RN.    Nuclear Med Study 1 or 2 day study: 1 day  Stress Test Type:  Treadmill/Lexiscan  Reading MD: N/A  Order Authorizing Provider:  Kristeen Miss, MD  Resting Radionuclide: Technetium 23m Sestamibi  Resting Radionuclide Dose: 11.0 mCi   Stress Radionuclide:  Technetium 76m Sestamibi  Stress Radionuclide Dose: 33.0 mCi           Stress Protocol Rest HR: 60 Stress HR: 104  Rest BP: 153/90 Stress BP: 149/77  Exercise Time (min): n/a METS: n/a   Predicted Max HR: 160 bpm % Max HR: 65 bpm Rate Pressure Product: 21308   Dose of Adenosine (mg):  n/a Dose of Lexiscan: 0.4 mg  Dose of Atropine (mg): n/a Dose of Dobutamine: n/a mcg/kg/min (at max HR)  Stress Test Technologist: Jared [name], BS-ES  Nuclear Technologist:  Jared [name], CNMT     Rest Procedure:  Myocardial perfusion imaging was performed at rest 45 minutes following the intravenous administration of Technetium 69m  Sestamibi. Rest ECG: Sinus rhythm, cannot R/O prior septal MI.  Stress Procedure:  The patient received IV Lexiscan 0.4 mg over 15-seconds with concurrent low level exercise and then Technetium 69m Sestamibi was injected at 30-seconds while the patient continued walking one more minute.  Quantitative spect images were obtained after a 45-minute delay. Stress ECG: No significant ST segment change suggestive of ischemia.  QPS Raw Data Images:  Acquisition technically good; normal left ventricular size. Stress Images:  Normal homogeneous uptake in all areas of the myocardium. Rest Images:  Normal homogeneous uptake in all areas of the myocardium. Subtraction (SDS):  No evidence of ischemia. Transient Ischemic Dilatation (Normal <1.22):  1.08 Lung/Heart Ratio (Normal <0.45):  0.37  Quantitative Gated Spect Images QGS EDV:  89 ml QGS ESV:  28 ml  Impression Exercise Capacity:  Lexiscan with low level exercise. BP Response:  Normal blood pressure response. Clinical Symptoms:  No chest pain or dyspnea. ECG Impression:  No significant ST segment change suggestive of ischemia. Comparison with Prior Nuclear Study: No images to compare  Overall Impression:  Normal stress nuclear study.  LV Ejection Fraction: 69%.  LV Wall Motion:  NL LV Function; NL Wall Motion   [name]

## 2014-02-03 ENCOUNTER — Encounter: Payer: Self-pay | Admitting: Physician Assistant

## 2014-02-03 ENCOUNTER — Telehealth: Payer: Self-pay | Admitting: Cardiovascular Disease

## 2014-02-03 NOTE — Telephone Encounter (Signed)
Clearance note by Tereso Newcomer, PA-C from patient's myocardial perfusion study faxed to [facility] at Dr. Laurena Slimmer office; confirmation received

## 2014-02-03 NOTE — Telephone Encounter (Signed)
New message      Pt is scheduled for surgery on Monday-----clearance was faxed today.  Did we get the clearance?

## 2014-02-06 DIAGNOSIS — M25552 Pain in left hip: Secondary | ICD-10-CM | POA: Insufficient documentation

## 2014-02-21 ENCOUNTER — Telehealth: Payer: Self-pay | Admitting: Nurse Practitioner

## 2014-02-21 NOTE — Telephone Encounter (Signed)
New problem    Pt stated he need office to call [facility] to auth his [place] call [phone].

## 2014-02-22 ENCOUNTER — Other Ambulatory Visit: Payer: Self-pay | Admitting: *Deleted

## 2014-02-22 MED ORDER — SILDENAFIL CITRATE 100 MG PO TABS: 50.0000 mg | ORAL_TABLET | Freq: Every day | ORAL | Status: DC | PRN

## 2014-02-22 NOTE — Telephone Encounter (Signed)
S/w pt insurance does not cover any ed medications.  Put in a new script for Viagra ( 100 mg ) take one half tablet ( 50 mg ) daily. Sent in to [facility] [place].  Put a coupon up front for pt to pick up.  Pt aware.

## 2014-02-22 NOTE — Telephone Encounter (Signed)
Jared [name] called pt's insurance does no cover ED drugs [phone]  [id number].

## 2014-04-18 ENCOUNTER — Telehealth: Payer: Self-pay | Admitting: *Deleted

## 2014-04-18 NOTE — Telephone Encounter (Signed)
Pt asked for a coupon for ERD left it up front pt did not pick up since August 30th will put away.

## 2014-10-10 ENCOUNTER — Encounter: Payer: Self-pay | Admitting: *Deleted

## 2014-12-12 ENCOUNTER — Other Ambulatory Visit: Payer: Self-pay | Admitting: Nurse Practitioner

## 2015-01-01 ENCOUNTER — Ambulatory Visit: Payer: BC Managed Care – PPO | Admitting: Nurse Practitioner

## 2015-01-02 ENCOUNTER — Ambulatory Visit: Payer: BLUE CROSS/BLUE SHIELD | Admitting: Nurse Practitioner

## 2015-02-14 ENCOUNTER — Encounter: Payer: Self-pay | Admitting: Nurse Practitioner

## 2015-02-14 ENCOUNTER — Ambulatory Visit (INDEPENDENT_AMBULATORY_CARE_PROVIDER_SITE_OTHER): Payer: BLUE CROSS/BLUE SHIELD | Admitting: Nurse Practitioner

## 2015-02-14 VITALS — BP 140/90 | HR 56 | Ht 67.0 in | Wt 199.4 lb

## 2015-02-14 DIAGNOSIS — I10 Essential (primary) hypertension: Secondary | ICD-10-CM

## 2015-02-14 DIAGNOSIS — E785 Hyperlipidemia, unspecified: Secondary | ICD-10-CM

## 2015-02-14 DIAGNOSIS — I259 Chronic ischemic heart disease, unspecified: Secondary | ICD-10-CM | POA: Diagnosis not present

## 2015-02-14 MED ORDER — METOPROLOL TARTRATE 50 MG PO TABS: ORAL_TABLET | ORAL | Status: DC

## 2015-02-14 NOTE — Patient Instructions (Addendum)
We will be checking the following labs today - NONE   Medication Instructions:    Continue with your current medicines.     Testing/Procedures To Be Arranged:  N/A  Follow-Up:   See me in one year  See Dr. Wylene Simmer as soon as you can.     Other Special Instructions:   N/A  Call the [facility] office at [phone] if you have any questions, problems or concerns.

## 2015-02-14 NOTE — Progress Notes (Signed)
CARDIOLOGY OFFICE NOTE  Date:  02/14/2015    Jared [name] Date of Birth: [birth year] Medical Record #[id number]  PCP:  Gaspar Garbe, MD  Cardiologist:  Nahser/Gerhardt    Chief Complaint  Patient presents with  . Coronary Artery Disease    Seen for Dr. Elease Hashimoto - former patient of Dr. Ronnald Nian    History of Present Illness: Jared [name] is a 59 y.o. male who presents today for a one year check. Seen for Dr. Elease Hashimoto. Former patient of Dr. Ronnald Nian. He has known CAD with prior CABG in 2009 with LIMA to LAD and SVG to DX. Intermediate vessel too small to bypass. Other issues include ED, HTN and HLD.  Last seen a year ago - was doing well but not really working on CV risk factors with exercise/diet, etc.   Comes back today. Here alone. Out of metoprolol for past 2 days. Off aspirin since September of last year following his hip surgery - did not think he was to take anymore. Asking for narcotics - to go to pain management - "hasn't got there yet".   Past Medical History  Diagnosis Date  . Coronary artery disease     CABG -- x2 -- 04/19/2008 -- EF of 70%left internal mammary artery to the LAD and a saphenous vein graft to the first diagonal with apparent intermediate coronary that was felt to be too small to bypass -- By Dr. Leotis Pain  . Fatigue   . Hyperlipidemia   . Hypercholesteremia     Myalgias with Zocor  . Osteoarthritis   . Hx of cardiovascular stress test     Lexiscan Myoview (9/15):  No ischemia, EF 69%; normal    Past Surgical History  Procedure Laterality Date  . Coronary artery bypass graft  04/19/2008    x2 -- EF of 70% -- left internal mammary artery to the LAD and a saphenous vein graft to the first diagonal with apparent intermediate coronary that was felt to be too small to bypass  . Cardiac catheterization  04/18/2008    severe stenosis in the left anterior descending and what was described at that point as the first and second diagonal vessels.  The  right coronary artery was nondominant .  The left circumfles had what was felt to be moderate sesions in its midportions  . Total hip arthroplasty       Medications: Current Outpatient Prescriptions  Medication Sig Dispense Refill  . gabapentin (NEURONTIN) 300 MG capsule Take 300 mg by mouth 3 (three) times daily.     . rosuvastatin (CRESTOR) 5 MG tablet Take 1 tablet (5 mg total) by mouth daily. 90 tablet 3  . sertraline (ZOLOFT) 50 MG tablet Take 50 mg by mouth daily.      . sildenafil (VIAGRA) 100 MG tablet Take 0.5 tablets (50 mg total) by mouth daily as needed for erectile dysfunction. 10 tablet 0  . tiZANidine (ZANAFLEX) 4 MG tablet Take 4 mg by mouth at bedtime.   0  . metoprolol (LOPRESSOR) 50 MG tablet TAKE ONE-HALF (1/2) TABLET TWICE A DAY (Patient not taking: Reported on 02/14/2015) 90 tablet 2   No current facility-administered medications for this visit.    Allergies: Allergies  Allergen Reactions  . Zocor [Simvastatin]     myalgias    Social History: The patient  reports that he has never smoked. He has never used smokeless tobacco. He reports that he does not drink alcohol or use illicit drugs.  Family History: The patient's family history includes Edema in his mother.   Review of Systems: Please see the history of present illness.   Otherwise, the review of systems is positive for none.   All other systems are reviewed and negative.   Physical Exam: VS:  BP 140/90 mmHg  Pulse 56  Ht  (1.702 m)  Wt 199 lb 6.4 oz (90.447 kg)  BMI 31.22 kg/m2 .  BMI Body mass index is 31.22 kg/(m^2).  Wt Readings from Last 3 Encounters:  02/14/15 199 lb 6.4 oz (90.447 kg)  02/02/14 198 lb (89.812 kg)  12/26/13 206 lb 12.8 oz (93.804 kg)    General: Pleasant. Well developed, well nourished and in no acute distress.  HEENT: Normal. Neck: Supple, no JVD, carotid bruits, or masses noted.  Cardiac: Regular rate and rhythm. No murmurs, rubs, or gallops. No edema.    Respiratory:  Lungs are clear to auscultation bilaterally with normal work of breathing.  GI: Soft and nontender.  MS: No deformity or atrophy. Gait and ROM intact. Skin: Warm and dry. Color is normal.  Neuro:  Strength and sensation are intact and no gross focal deficits noted.  Psych: Alert, appropriate and with normal affect.   LABORATORY DATA:  EKG:  EKG is ordered today. This demonstrates sinus bradycardia.  Lab Results  Component Value Date   GLUCOSE 116* 12/26/2010   CHOL 194 12/26/2010   TRIG 217.0* 12/26/2010   HDL 59.70 12/26/2010   LDLDIRECT 95.1 12/26/2010   ALT 22 12/26/2010   AST 22 12/26/2010   NA 142 12/26/2010   K 4.8 12/26/2010   CL 104 12/26/2010   CREATININE 0.9 12/26/2010   BUN 19 12/26/2010   CO2 26 12/26/2010    BNP (last 3 results) No results for input(s): BNP in the last 8760 hours.  ProBNP (last 3 results) No results for input(s): PROBNP in the last 8760 hours.   Other Studies Reviewed Today:  Myoview Impression from 01/2014 Exercise Capacity: Lexiscan with low level exercise. BP Response: Normal blood pressure response. Clinical Symptoms: No chest pain or dyspnea. ECG Impression: No significant ST segment change suggestive of ischemia. Comparison with Prior Nuclear Study: No images to compare  Overall Impression: Normal stress nuclear study.  LV Ejection Fraction: 69%. LV Wall Motion: NL LV Function; NL Wall Motion  [name]   Assessment/Plan: 1. CAD - prior CABG back in 2009 - stable Myoview from 2015 - he is doing well. See back in one year.   2. HTN - BP fair. Refilling metoprolol today.   3. HLD - to get labs by PCP - he is not fasting here today  4. Resting bradycardia - asymptomatic. I have left him on his current regimen.   Current medicines are reviewed with the patient today.  The patient does not have concerns regarding medicines other than what has been noted above.  The following changes have been  made:  See above.  Labs/ tests ordered today include:    Orders Placed This Encounter  Procedures  . EKG 12-Lead     Disposition:   FU with me in 1 year.   Patient is agreeable to this plan and will call if any problems develop in the interim.   Signed: Rosalio Macadamia, RN, ANP-C 02/14/2015 11:32 AM  [facility] [address] [place], [place]  [address] Phone: [phone] Fax: [phone]

## 2015-11-02 DIAGNOSIS — F5221 Male erectile disorder: Secondary | ICD-10-CM | POA: Diagnosis not present

## 2015-11-02 DIAGNOSIS — I2510 Atherosclerotic heart disease of native coronary artery without angina pectoris: Secondary | ICD-10-CM | POA: Diagnosis not present

## 2015-11-02 DIAGNOSIS — R7301 Impaired fasting glucose: Secondary | ICD-10-CM | POA: Diagnosis not present

## 2015-11-02 DIAGNOSIS — F321 Major depressive disorder, single episode, moderate: Secondary | ICD-10-CM | POA: Diagnosis not present

## 2015-11-02 DIAGNOSIS — M25552 Pain in left hip: Secondary | ICD-10-CM | POA: Diagnosis not present

## 2015-11-02 DIAGNOSIS — I480 Paroxysmal atrial fibrillation: Secondary | ICD-10-CM | POA: Diagnosis not present

## 2016-02-06 ENCOUNTER — Ambulatory Visit: Payer: BLUE CROSS/BLUE SHIELD | Admitting: Nurse Practitioner

## 2016-02-12 ENCOUNTER — Other Ambulatory Visit: Payer: Self-pay | Admitting: Nurse Practitioner

## 2016-02-13 ENCOUNTER — Ambulatory Visit (INDEPENDENT_AMBULATORY_CARE_PROVIDER_SITE_OTHER): Payer: BLUE CROSS/BLUE SHIELD | Admitting: Nurse Practitioner

## 2016-02-13 ENCOUNTER — Encounter: Payer: Self-pay | Admitting: Nurse Practitioner

## 2016-02-13 VITALS — BP 136/80 | HR 58 | Ht 68.0 in | Wt 192.8 lb

## 2016-02-13 DIAGNOSIS — I259 Chronic ischemic heart disease, unspecified: Secondary | ICD-10-CM | POA: Diagnosis not present

## 2016-02-13 DIAGNOSIS — E785 Hyperlipidemia, unspecified: Secondary | ICD-10-CM | POA: Diagnosis not present

## 2016-02-13 DIAGNOSIS — I10 Essential (primary) hypertension: Secondary | ICD-10-CM

## 2016-02-13 NOTE — Patient Instructions (Addendum)
We will be checking the following labs today - NONE   Medication Instructions:    Continue with your current medicines.     Testing/Procedures To Be Arranged:  N/A  Follow-Up:   See me in one year.     Other Special Instructions:   N/A    If you need a refill on your cardiac medications before your next appointment, please call your pharmacy.   Call the [facility] office at [phone] if you have any questions, problems or concerns.      

## 2016-02-13 NOTE — Progress Notes (Signed)
CARDIOLOGY OFFICE NOTE  Date:  02/13/2016    Jared [name] Date of Birth: [birth year] Medical Record #[id number]  PCP:  Gaspar Garbeichard W Tisovec, MD  Cardiologist:  [facility]age[facility]hardt [facility] Nahser  Chief Complaint  Patient presents with  . Coronary Artery Disease    1 year check - seen for Dr. Elease HashimotoNahser    History of Present Illness: Jared [name] is a 60 y.o. male who presents today for a 1 year check. Seen for Dr. Elease HashimotoNahser. Former patient of Dr. Ronnald Nianennant's.   He has known CAD with prior CABG in 2009 with LIMA to LAD and SVG to DX. Intermediate vessel too small to bypass. Other issues include ED, HTN and HLD.  Last seen a year ago - was doing well but not really working on CV risk factors with exercise/diet, etc. Going to pain management.   Comes back today. Here alone. Doing ok. Has seen Dr. Wylene Simmerisovec - had labs - on more cholesterol medicine now. No chest pain. Breathing is good. Limited by back and neck pain. No longer followed in pain management. Has lost about 7 pounds - trying to eat better. He is pretty happy with how he is doing. 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   Past Medical History:  Diagnosis Date  . Coronary artery disease    CABG -- x2 -- 04/19/2008 -- EF of 70%left internal mammary artery to the LAD and a saphenous vein graft to the first diagonal with apparent intermediate coronary that was felt to be too small to bypass -- By Dr. Leotis Painharles Harr  . Fatigue   . Hx of cardiovascular stress test    Lexiscan Myoview (9/15):  No ischemia, EF 69%; normal  . Hypercholesteremia    Myalgias with Zocor  . Hyperlipidemia   . Osteoarthritis     Past Surgical History:  Procedure Laterality Date  . CARDIAC CATHETERIZATION  04/18/2008   severe stenosis in the left anterior descending and what was described at that point as the first and second diagonal vessels.  The right coronary artery was nondominant .  The left circumfles had what was felt to be moderate  sesions in its midportions  . CORONARY ARTERY BYPASS GRAFT  04/19/2008   x2 -- EF of 70% -- left internal mammary artery to the LAD and a saphenous vein graft to the first diagonal with apparent intermediate coronary that was felt to be too small to bypass  . TOTAL HIP ARTHROPLASTY       Medications: Current Outpatient Prescriptions  Medication Sig Dispense Refill  . gabapentin (NEURONTIN) 300 MG capsule Take 300 mg by mouth 3 (three) times daily.     . metoprolol (LOPRESSOR) 50 MG tablet TAKE ONE-HALF (1/2) TABLET TWICE A DAY 90 tablet 3  . rosuvastatin (CRESTOR) 5 MG tablet Take 1 tablet (5 mg total) by mouth daily. 90 tablet 3  . sertraline (ZOLOFT) 50 MG tablet Take 50 mg by mouth daily.      . sildenafil (VIAGRA) 100 MG tablet Take 0.5 tablets (50 mg total) by mouth daily as needed for erectile dysfunction. 10 tablet 0  . tiZANidine (ZANAFLEX) 4 MG tablet Take 4 mg by mouth at bedtime.   0  . VASCEPA 1 g CAPS Take 1 tablet by mouth 2 (two) times daily.      No current facility-administered medications for this visit.     Allergies: Allergies  Allergen Reactions  . Zocor [Simvastatin]  myalgias    Social History: The patient  reports that he has never smoked. He has never used smokeless tobacco. He reports that he does not drink alcohol or use drugs.   Family History: The patient's family history includes Edema in his mother.   Review of Systems: Please see the history of present illness.   Otherwise, the review of systems is positive for none.   All other systems are reviewed and negative.   Physical Exam: VS:  BP 136/80   Pulse (!) 58   Ht 5\' 8"  (1.727 m)   Wt 192 lb 12.8 oz (87.5 kg)   BMI 29.32 kg/m  .  BMI Body mass index is 29.32 kg/m.  Wt Readings from Last 3 Encounters:  02/13/16 192 lb 12.8 oz (87.5 kg)  02/14/15 199 lb 6.4 oz (90.4 kg)  02/02/14 198 lb (89.8 kg)    General: Pleasant. Well developed, well nourished and in no acute distress.  He has  lost 7 pounds.  HEENT: Normal.  Neck: Supple, no JVD, carotid bruits, or masses noted.  Cardiac: Regular rate and rhythm. No murmurs, rubs, or gallops. No edema.  Respiratory:  Lungs are clear to auscultation bilaterally with normal work of breathing.  GI: Soft and nontender.  MS: No deformity or atrophy. Gait and ROM intact.  Skin: Warm and dry. Color is normal.  Neuro:  Strength and sensation are intact and no gross focal deficits noted.  Psych: Alert, appropriate and with normal affect.   LABORATORY DATA:  EKG:  EKG is ordered today. This demonstrates sinus bradycardia. Chronic T wave changes in V2.   Lab Results  Component Value Date   GLUCOSE 116 (H) 12/26/2010   CHOL 194 12/26/2010   TRIG 217.0 (H) 12/26/2010   HDL 59.70 12/26/2010   LDLDIRECT 95.1 12/26/2010   ALT 22 12/26/2010   AST 22 12/26/2010   NA 142 12/26/2010   K 4.8 12/26/2010   CL 104 12/26/2010   CREATININE 0.9 12/26/2010   BUN 19 12/26/2010   CO2 26 12/26/2010    BNP (last 3 results) No results for input(s): BNP in the last 8760 hours.  ProBNP (last 3 results) No results for input(s): PROBNP in the last 8760 hours.   Other Studies Reviewed Today:  Myoview Impression from 01/2014 Exercise Capacity: Lexiscan with low level exercise. BP Response: Normal blood pressure response. Clinical Symptoms: No chest pain or dyspnea. ECG Impression: No significant ST segment change suggestive of ischemia. Comparison with Prior Nuclear Study: No images to compare  Overall Impression: Normal stress nuclear study.  LV Ejection Fraction: 69%. LV Wall Motion: NL LV Function; NL Wall Motion  [name]   Assessment/Plan: 1. CAD - prior CABG back in 2009 - stable Myoview from 2015 - he is doing well. No symptoms.  See back in one year.   2. HTN - BP looks good on his current regimen. No changes made.   3. HLD - labs are followed by PCP - now on Vascepa  4. Resting bradycardia -  asymptomatic. I have left him on his current regimen.   Current medicines are reviewed with the patient today.  The patient does not have concerns regarding medicines other than what has been noted above.  The following changes have been made:  See above.  Labs/ tests ordered today include:   No orders of the defined types were placed in this encounter.    Disposition:   FU with me in one year. Labs by  PCP. No changes made today.  Patient is agreeable to this plan and will call if any problems develop in the interim.   Signed: Rosalio Macadamia, RN, ANP-C 02/13/2016 3:09 PM  [facility] [address] [place], [place]  [address] Phone: [phone] Fax: [phone]

## 2016-05-20 DIAGNOSIS — M5416 Radiculopathy, lumbar region: Secondary | ICD-10-CM | POA: Diagnosis not present

## 2016-05-20 DIAGNOSIS — M542 Cervicalgia: Secondary | ICD-10-CM | POA: Diagnosis not present

## 2016-05-20 DIAGNOSIS — M545 Low back pain: Secondary | ICD-10-CM | POA: Diagnosis not present

## 2016-05-23 DIAGNOSIS — M5416 Radiculopathy, lumbar region: Secondary | ICD-10-CM | POA: Diagnosis not present

## 2016-08-01 DIAGNOSIS — R7301 Impaired fasting glucose: Secondary | ICD-10-CM | POA: Diagnosis not present

## 2016-08-01 DIAGNOSIS — F321 Major depressive disorder, single episode, moderate: Secondary | ICD-10-CM | POA: Diagnosis not present

## 2016-08-01 DIAGNOSIS — I480 Paroxysmal atrial fibrillation: Secondary | ICD-10-CM | POA: Diagnosis not present

## 2016-08-01 DIAGNOSIS — Z0000 Encounter for general adult medical examination without abnormal findings: Secondary | ICD-10-CM | POA: Diagnosis not present

## 2016-08-01 DIAGNOSIS — F5221 Male erectile disorder: Secondary | ICD-10-CM | POA: Diagnosis not present

## 2016-08-05 ENCOUNTER — Encounter: Payer: Self-pay | Admitting: Internal Medicine

## 2016-10-03 ENCOUNTER — Encounter: Payer: BLUE CROSS/BLUE SHIELD | Admitting: Internal Medicine

## 2016-10-28 ENCOUNTER — Encounter: Payer: Self-pay | Admitting: Internal Medicine

## 2017-01-14 ENCOUNTER — Encounter: Payer: Self-pay | Admitting: Nurse Practitioner

## 2017-02-04 ENCOUNTER — Ambulatory Visit: Payer: BLUE CROSS/BLUE SHIELD | Admitting: Nurse Practitioner

## 2017-02-06 ENCOUNTER — Other Ambulatory Visit: Payer: Self-pay | Admitting: Nurse Practitioner

## 2017-02-19 DIAGNOSIS — E119 Type 2 diabetes mellitus without complications: Secondary | ICD-10-CM | POA: Diagnosis not present

## 2017-02-19 DIAGNOSIS — I259 Chronic ischemic heart disease, unspecified: Secondary | ICD-10-CM | POA: Diagnosis not present

## 2017-02-19 DIAGNOSIS — E781 Pure hyperglyceridemia: Secondary | ICD-10-CM | POA: Diagnosis not present

## 2017-02-19 DIAGNOSIS — I2510 Atherosclerotic heart disease of native coronary artery without angina pectoris: Secondary | ICD-10-CM | POA: Diagnosis not present

## 2017-02-19 DIAGNOSIS — Z23 Encounter for immunization: Secondary | ICD-10-CM | POA: Diagnosis not present

## 2017-02-19 DIAGNOSIS — I480 Paroxysmal atrial fibrillation: Secondary | ICD-10-CM | POA: Diagnosis not present

## 2017-03-24 ENCOUNTER — Ambulatory Visit: Payer: BLUE CROSS/BLUE SHIELD | Admitting: Nurse Practitioner

## 2017-03-24 DIAGNOSIS — M542 Cervicalgia: Secondary | ICD-10-CM | POA: Diagnosis not present

## 2017-03-24 DIAGNOSIS — M47892 Other spondylosis, cervical region: Secondary | ICD-10-CM | POA: Diagnosis not present

## 2017-03-24 DIAGNOSIS — M47816 Spondylosis without myelopathy or radiculopathy, lumbar region: Secondary | ICD-10-CM | POA: Diagnosis not present

## 2017-04-02 DIAGNOSIS — M47892 Other spondylosis, cervical region: Secondary | ICD-10-CM | POA: Diagnosis not present

## 2017-04-07 ENCOUNTER — Other Ambulatory Visit: Payer: Self-pay | Admitting: Nurse Practitioner

## 2017-04-07 DIAGNOSIS — M47892 Other spondylosis, cervical region: Secondary | ICD-10-CM | POA: Diagnosis not present

## 2017-04-09 DIAGNOSIS — M47892 Other spondylosis, cervical region: Secondary | ICD-10-CM | POA: Diagnosis not present

## 2017-04-13 DIAGNOSIS — M47892 Other spondylosis, cervical region: Secondary | ICD-10-CM | POA: Diagnosis not present

## 2017-04-21 DIAGNOSIS — M47892 Other spondylosis, cervical region: Secondary | ICD-10-CM | POA: Diagnosis not present

## 2017-04-21 NOTE — Progress Notes (Signed)
CARDIOLOGY OFFICE NOTE  Date:  04/22/2017    Jared [name] Date of Birth: 1956/04/02 Medical Record #[id number]  PCP:  Gaspar Garbeisovec, Richard W, MD  Cardiologist:  Tyrone SageGerhardt & Nahser    Chief Complaint  Patient presents with  . Coronary Artery Disease    1 year check - seen for Dr. Elease HashimotoNahser    History of Present Illness: Jared [name] is a 61 y.o. male who presents today for a follow up visit. Seen for Dr. Elease HashimotoNahser. Former patient of Dr. Ronnald Nianennant's.   He has known CAD with prior CABG in 2009 with LIMA to LAD and SVG to DX. Intermediate vessel too small to bypass. Other issues include ED, HTN and HLD.  Last seen in September of 2017. Was doing ok. Followed in pain management. Cardiac status ok.   Comes back today. Here alone. He says he is doing well. 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 Tolerating his medicines. More limited by chronic back and neck pain. Notes no real chest pain but will get a burning sensation in his throat with over exertion - this is new. Had multiple spells 4 to 5 months ago - was working in [employer] - only about one spell in the last month. Will last maybe 5 to 15 minutes - he says "not long". He does not have any NTG on hand. He notes it is somewhat similar to prior chest pain syndrome. He tells me repeatedly that he feels like he is doing just fine. Not short of breath. Lab from March noted by PCP.    Past Medical History:  Diagnosis Date  . Coronary artery disease    CABG -- x2 -- 04/19/2008 -- EF of 70%left internal mammary artery to the LAD and a saphenous vein graft to the first diagonal with apparent intermediate coronary that was felt to be too small to bypass -- By Dr. Leotis Painharles Harr  . Fatigue   . Hx of cardiovascular stress test    Lexiscan Myoview (9/15):  No ischemia, EF 69%; normal  . Hypercholesteremia    Myalgias with Zocor  . Hyperlipidemia   . Osteoarthritis     Past Surgical History:  Procedure Laterality Date  . CARDIAC  CATHETERIZATION  04/18/2008   severe stenosis in the left anterior descending and what was described at that point as the first and second diagonal vessels.  The right coronary artery was nondominant .  The left circumfles had what was felt to be moderate sesions in its midportions  . CORONARY ARTERY BYPASS GRAFT  04/19/2008   x2 -- EF of 70% -- left internal mammary artery to the LAD and a saphenous vein graft to the first diagonal with apparent intermediate coronary that was felt to be too small to bypass  . TOTAL HIP ARTHROPLASTY       Medications: Current Meds  Medication Sig  . gabapentin (NEURONTIN) 300 MG capsule Take 300 mg by mouth 3 (three) times daily.   . metoprolol tartrate (LOPRESSOR) 50 MG tablet TAKE ONE-HALF (1/2) TABLET (25 MG TOTAL) TWICE A DAY. PLEASE KEEP YOUR 03/24/17 TO AUTHORIZE FURTHER REFILLS  . rosuvastatin (CRESTOR) 5 MG tablet Take 1 tablet (5 mg total) by mouth daily.  . sertraline (ZOLOFT) 50 MG tablet Take 50 mg by mouth daily.    . sildenafil (VIAGRA) 100 MG tablet Take 0.5 tablets (50 mg total) by mouth daily as needed for erectile dysfunction.  Marland Kitchen. tiZANidine (ZANAFLEX) 4 MG tablet Take  4 mg by mouth at bedtime.   Marland Kitchen VASCEPA 1 g CAPS Take 1 tablet by mouth 2 (two) times daily.      Allergies: Allergies  Allergen Reactions  . Zocor [Simvastatin]     myalgias    Social History: The patient  reports that  has never smoked. he has never used smokeless tobacco. He reports that he does not drink alcohol or use drugs.   Family History: The patient's family history includes Edema in his mother.   Review of Systems: Please see the history of present illness.   Otherwise, the review of systems is positive for none.   All other systems are reviewed and negative.   Physical Exam: VS:  BP 124/68   Pulse (!) 57   Ht 5\' 8"  (1.727 m)   Wt 188 lb 12.8 oz (85.6 kg)   BMI 28.71 kg/m  .  BMI Body mass index is 28.71 kg/m.  Wt Readings from Last 3 Encounters:    04/22/17 188 lb 12.8 oz (85.6 kg)  02/13/16 192 lb 12.8 oz (87.5 kg)  02/14/15 199 lb 6.4 oz (90.4 kg)    General: Pleasant. Well developed, well nourished and in no acute distress.   HEENT: Normal.  Neck: Supple, no JVD, carotid bruits, or masses noted.  Cardiac: Regular rate and rhythm. No murmurs, rubs, or gallops. No edema.  Respiratory:  Lungs are clear to auscultation bilaterally with normal work of breathing.  GI: Soft and nontender.  MS: No deformity or atrophy. Gait and ROM intact.  Skin: Warm and dry. Color is normal.  Neuro:  Strength and sensation are intact and no gross focal deficits noted.  Psych: Alert, appropriate and with normal affect.   LABORATORY DATA:  EKG:  EKG is ordered today. This demonstrates sinus bradycardia.  Lab Results  Component Value Date   GLUCOSE 116 (H) 12/26/2010   CHOL 194 12/26/2010   TRIG 217.0 (H) 12/26/2010   HDL 59.70 12/26/2010   LDLDIRECT 95.1 12/26/2010   ALT 22 12/26/2010   AST 22 12/26/2010   NA 142 12/26/2010   K 4.8 12/26/2010   CL 104 12/26/2010   CREATININE 0.9 12/26/2010   BUN 19 12/26/2010   CO2 26 12/26/2010       BNP (last 3 results) No results for input(s): BNP in the last 8760 hours.  ProBNP (last 3 results) No results for input(s): PROBNP in the last 8760 hours.   Other Studies Reviewed Today:   Myoview Impression from 01/2014 Exercise Capacity: Lexiscan with low level exercise. BP Response: Normal blood pressure response. Clinical Symptoms: No chest pain or dyspnea. ECG Impression: No significant ST segment change suggestive of ischemia. Comparison with Prior Nuclear Study: No images to compare  Overall Impression: Normal stress nuclear study.  LV Ejection Fraction: 69%. LV Wall Motion: NL LV Function; NL Wall Motion  [name]   Assessment/Plan: 1. CAD - prior CABG back in 2009 - stable Myoview from 2015 - he feels like he is doing well - he did not seem too concerned  about this throat burning sensation - I would like his Myoview updated. NTG prescribed today - reminded in how to use. He tells me he feels like he could walk on the treadmill. Will arrange stress Myoview.   2. HTN - BP looks good on his current regimen. No changes made.   3. HLD - labs are followed by PCP - most recent studies noted above.   4. Resting bradycardia - asymptomatic.  I have left him on his current regimen.   5. Chronic pain syndrome   Current medicines are reviewed with the patient today.  The patient does not have concerns regarding medicines other than what has been noted above.  The following changes have been made:  See above.  Labs/ tests ordered today include:    Orders Placed This Encounter  Procedures  . MYOCARDIAL PERFUSION IMAGING  . EKG 12-Lead     Disposition:   FU with me in 1 year.   Patient is agreeable to this plan and will call if any problems develop in the interim.   SignedNorma Fredrickson: Chastin Garlitz, NP  04/22/2017 3:12 PM  [facility] [address] [place], [place]  [address] Phone: [phone] Fax: [phone]) [phone]

## 2017-04-22 ENCOUNTER — Ambulatory Visit (INDEPENDENT_AMBULATORY_CARE_PROVIDER_SITE_OTHER): Payer: BLUE CROSS/BLUE SHIELD | Admitting: Nurse Practitioner

## 2017-04-22 ENCOUNTER — Encounter: Payer: Self-pay | Admitting: Nurse Practitioner

## 2017-04-22 VITALS — BP 124/68 | HR 57 | Ht 68.0 in | Wt 188.8 lb

## 2017-04-22 DIAGNOSIS — E785 Hyperlipidemia, unspecified: Secondary | ICD-10-CM | POA: Diagnosis not present

## 2017-04-22 DIAGNOSIS — R0789 Other chest pain: Secondary | ICD-10-CM

## 2017-04-22 DIAGNOSIS — I10 Essential (primary) hypertension: Secondary | ICD-10-CM

## 2017-04-22 DIAGNOSIS — I2510 Atherosclerotic heart disease of native coronary artery without angina pectoris: Secondary | ICD-10-CM | POA: Diagnosis not present

## 2017-04-22 MED ORDER — NITROGLYCERIN 0.4 MG SL SUBL: 0.4000 mg | SUBLINGUAL_TABLET | SUBLINGUAL | 3 refills | Status: DC | PRN

## 2017-04-22 NOTE — Patient Instructions (Addendum)
We will be checking the following labs today - NONE   Medication Instructions:    Continue with your current medicines. BUT I am adding NTG - Use your NTG under your tongue for recurrent chest pain. May take one tablet every 5 minutes. If you are still having discomfort after 3 tablets in 15 minutes, call 911.     Testing/Procedures To Be Arranged:  Stress Myoview  Follow-Up:   See me in 6 months - but call me if your symptoms persist - even if you do well on your stress test    Other Special Instructions:   N/A    If you need a refill on your cardiac medications before your next appointment, please call your pharmacy.   Call the [facility] office at [phone] if you have any questions, problems or concerns.

## 2017-05-12 ENCOUNTER — Telehealth (HOSPITAL_COMMUNITY): Payer: Self-pay | Admitting: *Deleted

## 2017-05-12 NOTE — Telephone Encounter (Signed)
Left message on voicemail per DPR in reference to upcoming appointment scheduled on 05/15/17 at 0715 with detailed instructions given per Myocardial Perfusion Study Information Sheet for the test. LM to arrive 15 minutes early, and that it is imperative to arrive on time for appointment to keep from having the test rescheduled. If you need to cancel or reschedule your appointment, please call the office within 24 hours of your appointment. Failure to do so may result in a cancellation of your appointment, and a $50 no show fee. Phone number given for call back for any questions.

## 2017-05-15 ENCOUNTER — Ambulatory Visit (HOSPITAL_COMMUNITY): Payer: BLUE CROSS/BLUE SHIELD | Attending: Internal Medicine

## 2017-05-15 DIAGNOSIS — I10 Essential (primary) hypertension: Secondary | ICD-10-CM | POA: Insufficient documentation

## 2017-05-15 DIAGNOSIS — E785 Hyperlipidemia, unspecified: Secondary | ICD-10-CM | POA: Insufficient documentation

## 2017-05-15 DIAGNOSIS — I2510 Atherosclerotic heart disease of native coronary artery without angina pectoris: Secondary | ICD-10-CM | POA: Insufficient documentation

## 2017-05-15 LAB — MYOCARDIAL PERFUSION IMAGING
Estimated workload: 9.7 METS
Exercise duration (min): 7 min
Exercise duration (sec): 45 s
LV dias vol: 105 mL (ref 62–150)
LV sys vol: 37 mL
MPHR: 157 {beats}/min
Peak HR: 139 {beats}/min
Percent HR: 88 %
RATE: 0.31
Rest HR: 61 {beats}/min
SDS: 4
SRS: 2
SSS: 6
TID: 0.94

## 2017-05-15 MED ORDER — TECHNETIUM TC 99M TETROFOSMIN IV KIT
31.6000 | PACK | Freq: Once | INTRAVENOUS | Status: AC | PRN
  Administered 2017-05-15: 31.6 via INTRAVENOUS
  Filled 2017-05-15: qty 32

## 2017-05-15 MED ORDER — TECHNETIUM TC 99M TETROFOSMIN IV KIT
9.1000 | PACK | Freq: Once | INTRAVENOUS | Status: AC | PRN
  Administered 2017-05-15: 9.1 via INTRAVENOUS
  Filled 2017-05-15: qty 10

## 2017-05-21 ENCOUNTER — Telehealth: Payer: Self-pay | Admitting: Nurse Practitioner

## 2017-05-21 NOTE — Telephone Encounter (Signed)
Will see back then to discuss possible cardiac catheterization. IF he were to have any recurrent chest pain/throat burning - go to the nearest ER.   Let's make sure he has NTG on hand.

## 2017-05-21 NOTE — Telephone Encounter (Signed)
Spoke with patient to review results of stress test. [place]. He denies complaints. I scheduled him to see Lawson FiscalLori on 1/28 and advised him that I will route a note to Lawson FiscalLori and ask her to call him if she needs to discuss test results sooner. He verbalized understanding and agreement and thanked me for the call.

## 2017-05-21 NOTE — Telephone Encounter (Signed)
-----   Message from Rosalio MacadamiaLori C Gerhardt, NP sent at 05/15/2017  4:16 PM EST ----- Intermediate study = I need to see him back after Christmas to discuss. Not sure when he is actually back in town.

## 2017-05-21 NOTE — Telephone Encounter (Signed)
Reviewed Jared FiscalLori [name]'s instructions with patient who verbalized understanding and agreement. He states he does have NTG on hand. He thanked me for the call.

## 2017-06-22 ENCOUNTER — Ambulatory Visit
Admission: RE | Admit: 2017-06-22 | Discharge: 2017-06-22 | Disposition: A | Discharge status: Home / Self Care | Payer: BLUE CROSS/BLUE SHIELD | Source: Ambulatory Visit | Attending: Nurse Practitioner | Admitting: Nurse Practitioner

## 2017-06-22 ENCOUNTER — Ambulatory Visit: Payer: BLUE CROSS/BLUE SHIELD | Admitting: Nurse Practitioner

## 2017-06-22 ENCOUNTER — Encounter: Payer: Self-pay | Admitting: Nurse Practitioner

## 2017-06-22 ENCOUNTER — Telehealth: Payer: Self-pay | Admitting: *Deleted

## 2017-06-22 VITALS — BP 152/80 | HR 54 | Ht 68.0 in | Wt 191.0 lb

## 2017-06-22 DIAGNOSIS — E7849 Other hyperlipidemia: Secondary | ICD-10-CM

## 2017-06-22 DIAGNOSIS — R9439 Abnormal result of other cardiovascular function study: Secondary | ICD-10-CM

## 2017-06-22 DIAGNOSIS — I259 Chronic ischemic heart disease, unspecified: Secondary | ICD-10-CM

## 2017-06-22 DIAGNOSIS — E785 Hyperlipidemia, unspecified: Secondary | ICD-10-CM | POA: Diagnosis not present

## 2017-06-22 DIAGNOSIS — I10 Essential (primary) hypertension: Secondary | ICD-10-CM | POA: Diagnosis not present

## 2017-06-22 DIAGNOSIS — R0789 Other chest pain: Secondary | ICD-10-CM

## 2017-06-22 LAB — HEPATIC FUNCTION PANEL
ALT: 27 IU/L (ref 0–44)
AST: 23 IU/L (ref 0–40)
Albumin: 4.6 g/dL (ref 3.6–4.8)
Alkaline Phosphatase: 66 IU/L (ref 39–117)
Bilirubin Total: 0.4 mg/dL (ref 0.0–1.2)
Bilirubin, Direct: 0.13 mg/dL (ref 0.00–0.40)
Total Protein: 7 g/dL (ref 6.0–8.5)

## 2017-06-22 LAB — CBC
Hematocrit: 43.3 % (ref 37.5–51.0)
Hemoglobin: 15.1 g/dL (ref 13.0–17.7)
MCH: 31.4 pg (ref 26.6–33.0)
MCHC: 34.9 g/dL (ref 31.5–35.7)
MCV: 90 fL (ref 79–97)
Platelets: 277 10*3/uL (ref 150–379)
RBC: 4.81 x10E6/uL (ref 4.14–5.80)
RDW: 12.5 % (ref 12.3–15.4)
WBC: 8.3 10*3/uL (ref 3.4–10.8)

## 2017-06-22 LAB — LIPID PANEL
Chol/HDL Ratio: 2.9 ratio (ref 0.0–5.0)
Cholesterol, Total: 131 mg/dL (ref 100–199)
HDL: 45 mg/dL (ref >39)
LDL Calculated: 41 mg/dL (ref 0–99)
Triglycerides: 225 mg/dL — ABNORMAL HIGH (ref 0–149)
VLDL Cholesterol Cal: 45 mg/dL — ABNORMAL HIGH (ref 5–40)

## 2017-06-22 LAB — PT AND PTT
INR: 1.1 (ref 0.8–1.2)
Prothrombin Time: 11 s (ref 9.1–12.0)
aPTT: 27 s (ref 24–33)

## 2017-06-22 LAB — BASIC METABOLIC PANEL
BUN/Creatinine Ratio: 20 (ref 10–24)
BUN: 15 mg/dL (ref 8–27)
CO2: 24 mmol/L (ref 20–29)
Calcium: 8.9 mg/dL (ref 8.6–10.2)
Chloride: 100 mmol/L (ref 96–106)
Creatinine, Ser: 0.76 mg/dL (ref 0.76–1.27)
GFR calc Af Amer: 112 mL/min/{1.73_m2} (ref >59)
GFR calc non Af Amer: 97 mL/min/{1.73_m2} (ref >59)
Glucose: 132 mg/dL — ABNORMAL HIGH (ref 65–99)
Potassium: 4.1 mmol/L (ref 3.5–5.2)
Sodium: 141 mmol/L (ref 134–144)

## 2017-06-22 MED ORDER — ASPIRIN EC 81 MG PO TBEC: 81.0000 mg | DELAYED_RELEASE_TABLET | Freq: Every day | ORAL | 3 refills | Status: DC

## 2017-06-22 NOTE — H&P (View-Only) (Signed)
CARDIOLOGY OFFICE NOTE  Date:  06/22/2017    Rodena Goldmann Mcglinn Date of Birth: 10-08-1955 Medical Record #[id number]  PCP:  Gaspar Garbe, MD  Cardiologist:  Tyrone Sage & Nahser  Chief Complaint  Patient presents with  . Coronary Artery Disease  . Hyperlipidemia  . Hypertension    Work in visit after abnormal Myoview - seen for Dr. Elease Hashimoto    History of Present Illness: Geovanni Rahming Goings is a 62 y.o. male who presents today for a work in visit. Seen for Dr. Elease Hashimoto. Former patient of Dr. Ronnald Nian.   He has known CAD with prior CABG in 2009 with LIMA to LAD and SVG to DX.  Intermediate vessel too small to bypass. His surgery was in [place]. Other issues include ED, HTN and HLD. No prior history of MI noted.   Last seen in November - some chest pain reported - somewhat similar to prior chest pain syndrome - referred for Myoview - this was abnormal. He was working out of town and was not able to come back for follow up until today.   Comes back today. Here with a friend today. He continues to have some intermittent throat pain - says it is no worse - happens with rest and with exertion. Has not used any NTG that I gave him but he does have it. Occasional palpitation noted - nothing that makes him feel bad. He is now back in town and ready to proceed. Stress test has been reviewed - he has no history of prior MI. He notes he has stopped his aspirin - unclear as to why - but willing to restart.    Past Medical History:  Diagnosis Date  . Coronary artery disease    CABG -- x2 -- 04/19/2008 -- EF of 70%left internal mammary artery to the LAD and a saphenous vein graft to the first diagonal with apparent intermediate coronary that was felt to be too small to bypass -- By Dr. Leotis Pain  . Fatigue   . Hx of cardiovascular stress test    Lexiscan Myoview (9/15):  No ischemia, EF 69%; normal  . Hypercholesteremia    Myalgias with Zocor  . Hyperlipidemia   . Osteoarthritis     Past  Surgical History:  Procedure Laterality Date  . CARDIAC CATHETERIZATION  04/18/2008   severe stenosis in the left anterior descending and what was described at that point as the first and second diagonal vessels.  The right coronary artery was nondominant .  The left circumfles had what was felt to be moderate sesions in its midportions  . CORONARY ARTERY BYPASS GRAFT  04/19/2008   x2 -- EF of 70% -- left internal mammary artery to the LAD and a saphenous vein graft to the first diagonal with apparent intermediate coronary that was felt to be too small to bypass  . TOTAL HIP ARTHROPLASTY       Medications: Current Meds  Medication Sig  . gabapentin (NEURONTIN) 600 MG tablet Take 600 mg by mouth 3 (three) times daily.   . meloxicam (MOBIC) 15 MG tablet Take 15 mg by mouth daily.   . metoprolol tartrate (LOPRESSOR) 25 MG tablet Take 25 mg by mouth 2 (two) times daily.  . nitroGLYCERIN (NITROSTAT) 0.4 MG SL tablet Place 1 tablet (0.4 mg total) under the tongue every 5 (five) minutes as needed for chest pain.  . rosuvastatin (CRESTOR) 5 MG tablet Take 1 tablet (5 mg total) by mouth daily.  Marland Kitchen  sertraline (ZOLOFT) 50 MG tablet Take 50 mg by mouth daily.    . sildenafil (VIAGRA) 100 MG tablet Take 0.5 tablets (50 mg total) by mouth daily as needed for erectile dysfunction.  Marland Kitchen tiZANidine (ZANAFLEX) 4 MG tablet Take 4 mg by mouth at bedtime.   Marland Kitchen VASCEPA 1 g CAPS Take 1 tablet by mouth 2 (two) times daily.   . [DISCONTINUED] gabapentin (NEURONTIN) 300 MG capsule Take 300 mg by mouth 3 (three) times daily.   . [DISCONTINUED] metoprolol tartrate (LOPRESSOR) 50 MG tablet TAKE ONE-HALF (1/2) TABLET (25 MG TOTAL) TWICE A DAY. PLEASE KEEP YOUR 03/24/17 TO AUTHORIZE FURTHER REFILLS (Patient taking differently: TAKE ONE-HALF (1/2) TABLET (25 MG TOTAL) TWICE A DAY.)     Allergies: Allergies  Allergen Reactions  . Zocor [Simvastatin]     myalgias    Social History: The patient  reports that  has never  smoked. he has never used smokeless tobacco. He reports that he does not drink alcohol or use drugs.   Family History: The patient's family history includes Edema in his mother.   Review of Systems: Please see the history of present illness.   Otherwise, the review of systems is positive for none.   All other systems are reviewed and negative.   Physical Exam: VS:  BP (!) 152/80 (BP Location: Left Arm, Patient Position: Sitting, Cuff Size: Normal)   Pulse (!) 54   Ht 5\' 8"  (1.727 m)   Wt 191 lb (86.6 kg)   SpO2 95% Comment: at rest  BMI 29.04 kg/m  .  BMI Body mass index is 29.04 kg/m.  Wt Readings from Last 3 Encounters:  06/22/17 191 lb (86.6 kg)  04/22/17 188 lb 12.8 oz (85.6 kg)  02/13/16 192 lb 12.8 oz (87.5 kg)    General: Pleasant. Well developed, well nourished and in no acute distress.   HEENT: Normal.  Neck: Supple, no JVD, carotid bruits, or masses noted.  Cardiac: Regular rate and rhythm. No murmurs, rubs, or gallops. No edema.  Respiratory:  Lungs are clear to auscultation bilaterally with normal work of breathing.  GI: Soft and nontender.  MS: No deformity or atrophy. Gait and ROM intact.  Skin: Warm and dry. Color is normal.  Neuro:  Strength and sensation are intact and no gross focal deficits noted.  Psych: Alert, appropriate and with normal affect.   LABORATORY DATA:  EKG:  EKG is not ordered today.  Lab Results  Component Value Date   GLUCOSE 116 (H) 12/26/2010   CHOL 194 12/26/2010   TRIG 217.0 (H) 12/26/2010   HDL 59.70 12/26/2010   LDLDIRECT 95.1 12/26/2010   ALT 22 12/26/2010   AST 22 12/26/2010   NA 142 12/26/2010   K 4.8 12/26/2010   CL 104 12/26/2010   CREATININE 0.9 12/26/2010   BUN 19 12/26/2010   CO2 26 12/26/2010     BNP (last 3 results) No results for input(s): BNP in the last 8760 hours.  ProBNP (last 3 results) No results for input(s): PROBNP in the last 8760 hours.   Other Studies Reviewed Today:  Myoview Study  Highlights 04/2017    Nuclear stress EF: 65%. There is septal wall akinesis (post CABG septal wall changes).  There was no ST segment deviation noted during stress.  Defect 1: There is a large defect of moderate severity present in the basal inferior, mid inferior and apical inferior location.  Findings consistent with prior myocardial infarction with peri-infarct ischemia.  This is an  intermediate risk study.   Donato SchultzMark Skains, MD     Assessment/Plan:  1. CAD - prior CABG back in 2009 - now with intermediate stress Myoview - no prior history of MI - he continues to have intermittent throat pain - similar to prior chest pain syndrome - cardiac catheterization has been recommended - The patient understands that risks include but are not limited to stroke (1 in 1000), death (1 in 1000), kidney failure [usually temporary] (1 in 500), bleeding (1 in 200), allergic reaction [possibly serious] (1 in 200), and agrees to proceed. Scheduled for later this week with Dr. SwazilandJordan. Aspirin restarted. He has NTG on hand. Lab and CXR today.   2. HTN - BPfair - would follow for now - recheck on return visit.   3. HLD - labs are followed by PCP - rechecking today.   4. Resting bradycardia - asymptomatic. I have left him on his current regimen.  5. Chronic pain syndrome - he is followed in the pain clinic  6. Palpitations    Current medicines are reviewed with the patient today.  The patient does not have concerns regarding medicines other than what has been noted above.  The following changes have been made:  See above.  Labs/ tests ordered today include:   No orders of the defined types were placed in this encounter.    Disposition:   FU with me in 2 to 3 weeks with me for post cath visit.   Patient is agreeable to this plan and will call if any problems develop in the interim.   SignedNorma Fredrickson: Coreena Rubalcava, NP  06/22/2017 9:09 AM  [facility] [address] [place], [place]  [address] Phone: [phone] Fax: [phone]

## 2017-06-22 NOTE — Telephone Encounter (Signed)
Patient contacted pre-catheterization at [facility] scheduled for: 06/24/17 1:00 PM Verified arrival time and place: [facility] 11:00 AM Nothing to eat or drink after midnight Confirmed allergies listed.  Confirmed AM meds to be taken pre-cath with sip of water: ASA 81 mg am of  Hold: No Viagra 24 hours before procedure.  Confirmed patient has responsible person to drive home post procedure and observe patient for 24 hours: yes

## 2017-06-22 NOTE — Progress Notes (Signed)
CARDIOLOGY OFFICE NOTE  Date:  06/22/2017    Jared [name] Date of Birth: 10-08-1955 Medical Record #[id number]  PCP:  Jared Garbe, MD  Cardiologist:  Jared [name] & Jared [name]  Chief Complaint  Patient presents with  . Coronary Artery Disease  . Hyperlipidemia  . Hypertension    Work in visit after abnormal Myoview - seen for Dr. Elease [name]    History of Present Illness: Jared [name] is a 62 y.o. male who presents today for a work in visit. Seen for Dr. Elease [name]. Former patient of Dr. Ronnald [name].   He has known CAD with prior CABG in 2009 with LIMA to LAD and SVG to DX.  Intermediate vessel too small to bypass. His surgery was in [place]. Other issues include ED, HTN and HLD. No prior history of MI noted.   Last seen in November - some chest [name] reported - somewhat similar to prior chest [name] syndrome - referred for Myoview - this was abnormal. He was working out of town and was not able to come back for follow up until today.   Comes back today. Here with a friend today. He continues to have some intermittent throat [name] - says it is no worse - happens with rest and with exertion. Has not used any NTG that I gave him but he does have it. Occasional palpitation noted - nothing that makes him feel bad. He is now back in town and ready to proceed. Stress test has been reviewed - he has no history of prior MI. He notes he has stopped his aspirin - unclear as to why - but willing to restart.    Past Medical History:  Diagnosis Date  . Coronary artery disease    CABG -- x2 -- 04/19/2008 -- EF of 70%left internal mammary artery to the LAD and a saphenous vein graft to the first diagonal with apparent intermediate coronary that was felt to be too small to bypass -- By Dr. Leotis [name]  . Fatigue   . Hx of cardiovascular stress test    Lexiscan Myoview (9/15):  No ischemia, EF 69%; normal  . Hypercholesteremia    Myalgias with Zocor  . Hyperlipidemia   . Osteoarthritis     Past  Surgical History:  Procedure Laterality Date  . CARDIAC CATHETERIZATION  04/18/2008   severe stenosis in the left anterior descending and what was described at that point as the first and second diagonal vessels.  The right coronary artery was nondominant .  The left circumfles had what was felt to be moderate sesions in its midportions  . CORONARY ARTERY BYPASS GRAFT  04/19/2008   x2 -- EF of 70% -- left internal mammary artery to the LAD and a saphenous vein graft to the first diagonal with apparent intermediate coronary that was felt to be too small to bypass  . TOTAL HIP ARTHROPLASTY       Medications: Current Meds  Medication Sig  . gabapentin (NEURONTIN) 600 MG tablet Take 600 mg by mouth 3 (three) times daily.   . meloxicam (MOBIC) 15 MG tablet Take 15 mg by mouth daily.   . metoprolol tartrate (LOPRESSOR) 25 MG tablet Take 25 mg by mouth 2 (two) times daily.  . nitroGLYCERIN (NITROSTAT) 0.4 MG SL tablet Place 1 tablet (0.4 mg total) under the tongue every 5 (five) minutes as needed for chest [name].  . rosuvastatin (CRESTOR) 5 MG tablet Take 1 tablet (5 mg total) by mouth daily.  Marland Kitchen  sertraline (ZOLOFT) 50 MG tablet Take 50 mg by mouth daily.    . sildenafil (VIAGRA) 100 MG tablet Take 0.5 tablets (50 mg total) by mouth daily as needed for erectile dysfunction.  Marland Kitchen tiZANidine (ZANAFLEX) 4 MG tablet Take 4 mg by mouth at bedtime.   Marland Kitchen VASCEPA 1 g CAPS Take 1 tablet by mouth 2 (two) times daily.   . [DISCONTINUED] gabapentin (NEURONTIN) 300 MG capsule Take 300 mg by mouth 3 (three) times daily.   . [DISCONTINUED] metoprolol tartrate (LOPRESSOR) 50 MG tablet TAKE ONE-HALF (1/2) TABLET (25 MG TOTAL) TWICE A DAY. PLEASE KEEP YOUR 03/24/17 TO AUTHORIZE FURTHER REFILLS (Patient taking differently: TAKE ONE-HALF (1/2) TABLET (25 MG TOTAL) TWICE A DAY.)     Allergies: Allergies  Allergen Reactions  . Zocor [Simvastatin]     myalgias    Social History: The patient  reports that  has never  smoked. he has never used smokeless tobacco. He reports that he does not drink alcohol or use drugs.   Family History: The patient's family history includes Edema in his mother.   Review of Systems: Please see the history of present illness.   Otherwise, the review of systems is positive for none.   All other systems are reviewed and negative.   Physical Exam: VS:  BP (!) 152/80 (BP Location: Left Arm, Patient Position: Sitting, Cuff Size: Normal)   Pulse (!) 54   Ht 5\' 8"  (1.727 m)   Wt 191 lb (86.6 kg)   SpO2 95% Comment: at rest  BMI 29.04 kg/m  .  BMI Body mass index is 29.04 kg/m.  Wt Readings from Last 3 Encounters:  06/22/17 191 lb (86.6 kg)  04/22/17 188 lb 12.8 oz (85.6 kg)  02/13/16 192 lb 12.8 oz (87.5 kg)    General: Pleasant. Well developed, well nourished and in no acute distress.   HEENT: Normal.  Neck: Supple, no JVD, carotid bruits, or masses noted.  Cardiac: Regular rate and rhythm. No murmurs, rubs, or gallops. No edema.  Respiratory:  Lungs are clear to auscultation bilaterally with normal work of breathing.  GI: Soft and nontender.  MS: No deformity or atrophy. Gait and ROM intact.  Skin: Warm and dry. Color is normal.  Neuro:  Strength and sensation are intact and no gross focal deficits noted.  Psych: Alert, appropriate and with normal affect.   LABORATORY DATA:  EKG:  EKG is not ordered today.  Lab Results  Component Value Date   GLUCOSE 116 (H) 12/26/2010   CHOL 194 12/26/2010   TRIG 217.0 (H) 12/26/2010   HDL 59.70 12/26/2010   LDLDIRECT 95.1 12/26/2010   ALT 22 12/26/2010   AST 22 12/26/2010   NA 142 12/26/2010   K 4.8 12/26/2010   CL 104 12/26/2010   CREATININE 0.9 12/26/2010   BUN 19 12/26/2010   CO2 26 12/26/2010     BNP (last 3 results) No results for input(s): BNP in the last 8760 hours.  ProBNP (last 3 results) No results for input(s): PROBNP in the last 8760 hours.   Other Studies Reviewed Today:  Myoview Study  Highlights 04/2017    Nuclear stress EF: 65%. There is septal wall akinesis (post CABG septal wall changes).  There was no ST segment deviation noted during stress.  Defect 1: There is a large defect of moderate severity present in the basal inferior, mid inferior and apical inferior location.  Findings consistent with prior myocardial infarction with peri-infarct ischemia.  This is an  intermediate risk study.   Jared SchultzMark Skains, MD     Assessment/Plan:  1. CAD - prior CABG back in 2009 - now with intermediate stress Myoview - no prior history of MI - he continues to have intermittent throat [name] - similar to prior chest [name] syndrome - cardiac catheterization has been recommended - The patient understands that risks include but are not limited to stroke (1 in 1000), death (1 in 1000), kidney failure [usually temporary] (1 in 500), bleeding (1 in 200), allergic reaction [possibly serious] (1 in 200), and agrees to proceed. Scheduled for later this week with Dr. SwazilandJordan. Aspirin restarted. He has NTG on hand. Lab and CXR today.   2. HTN - BPfair - would follow for now - recheck on return visit.   3. HLD - labs are followed by PCP - rechecking today.   4. Resting bradycardia - asymptomatic. I have left him on his current regimen.  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 Palpitations    Current medicines are reviewed with the patient today.  The patient does not have concerns regarding medicines other than what has been noted above.  The following changes have been made:  See above.  Labs/ tests ordered today include:   No orders of the defined types were placed in this encounter.    Disposition:   FU with me in 2 to 3 weeks with me for post cath visit.   Patient is agreeable to this plan and will call if any problems develop in the interim.   SignedNorma Fredrickson: Ahlijah Raia, NP  06/22/2017 9:09 AM  [facility] [address] [place], [place]  [address] Phone: [phone] Fax: [phone]

## 2017-06-22 NOTE — Patient Instructions (Addendum)
We will be checking the following labs today - BMET, CBC, PT & PTT  Please go to [facility] to [facility] on the first floor for a chest Xray - you may walk in.    Medication Instructions:    Continue with your current medicines.   I would like for you to resume your aspirin - 81 mg daily    Testing/Procedures To Be Arranged:  Cardiac catheterization  Follow-Up:   See me in 2 to 3 weeks    Other Special Instructions:   Your provider has recommended a cardiac catherization  You are scheduled for a cardiac catheterization on Wednesday, January 30th at 1:00PM with Dr. Swaziland or associate.  Please arrive at the [facility] (Main Entrance) at [facility] at [address], [place] -  2nd Floor Short Stay on Wednesday, January 30th at 11 AM.    Special note: Every effort is made to have your procedure done on time.   Please understand that emergencies sometimes delay a scheduled   procedure.  No food or drink after midnight on Tuesday.  You may take your morning medications with a sip of water on the day of your procedure.  Please take a baby aspirin (81 mg) on the morning of your procedure.   Medications to HOLD - NONE  Plan for a one night stay -- bring personal belongings.  Bring a current list of your medications and current insurance cards.  You MUST have a responsible person to drive you home. Someone MUST be with you the first 24 hours after you arrive home or your discharge will be delayed. Wear clothes that are easy to get on and off and wear slip on shoes.    Coronary Angiogram A coronary angiogram, also called coronary angiography, is an X-ray procedure used to look at the arteries in the heart. In this procedure, a dye (contrast dye) is injected through a long, hollow tube (catheter). The catheter is about the size of a piece of cooked spaghetti and is inserted through your groin, wrist, or arm. The dye is injected into each artery, and  X-rays are then taken to show if there is a blockage in the arteries of your heart.  LET [facility] CARE PROVIDER KNOW ABOUT: Any allergies you have, including allergies to shellfish or contrast dye.  All medicines you are taking, including vitamins, herbs, eye drops, creams, and over-the-counter medicines.  Previous problems you or members of your family have had with the use of anesthetics.  Any blood disorders you have.  Previous surgeries you have had. History of kidney problems or failure.  Other medical conditions you have.  RISKS AND COMPLICATIONS  Generally, a coronary angiogram is a safe procedure. However, about 1 person out of 1000 can have problems that may include: Allergic reaction to the dye. Bleeding/bruising from the access site or other locations. Kidney injury, especially in people with impaired kidney function. Stroke (rare). Heart attack (rare). Irregular rhythms (rare) Death (rare)  BEFORE THE PROCEDURE  Do not eat or drink anything after midnight the night before the procedure or as directed by your health care provider.  Ask your health care provider about changing or stopping your regular medicines. This is especially important if you are taking diabetes medicines or blood thinners.  PROCEDURE You may be given a medicine to help you relax (sedative) before the procedure. This medicine is given through an intravenous (IV) access tube that is inserted into one of your veins.  The area where the catheter will be inserted will be washed and shaved. This is usually done in the groin but may be done in the fold of your arm (near your elbow) or in the wrist.  A medicine will be given to numb the area where the catheter will be inserted (local anesthetic).  The health care provider will insert the catheter into an artery. The catheter will be guided by using a special type of X-ray (fluoroscopy) of the blood vessel being examined.  A special dye will then  be injected into the catheter, and X-rays will be taken. The dye will help to show where any narrowing or blockages are located in the heart arteries.   AFTER THE PROCEDURE  If the procedure is done through the leg, you will be kept in bed lying flat for several hours. You will be instructed to not bend or cross your legs. The insertion site will be checked frequently.  The pulse in your feet or wrist will be checked frequently.  Additional blood tests, X-rays, and an electrocardiogram may be done.      If you need a refill on your cardiac medications before your next appointment, please call your pharmacy.   Call the [facility] office at [phone] if you have any questions, problems or concerns.

## 2017-06-23 DIAGNOSIS — M47816 Spondylosis without myelopathy or radiculopathy, lumbar region: Secondary | ICD-10-CM | POA: Diagnosis not present

## 2017-06-23 DIAGNOSIS — M47892 Other spondylosis, cervical region: Secondary | ICD-10-CM | POA: Diagnosis not present

## 2017-06-23 DIAGNOSIS — M5136 Other intervertebral disc degeneration, lumbar region: Secondary | ICD-10-CM | POA: Diagnosis not present

## 2017-06-23 DIAGNOSIS — M542 Cervicalgia: Secondary | ICD-10-CM | POA: Diagnosis not present

## 2017-06-24 ENCOUNTER — Ambulatory Visit (HOSPITAL_COMMUNITY)
Admission: RE | Admit: 2017-06-24 | Discharge: 2017-06-25 | Disposition: A | Discharge status: Home / Self Care | Payer: BLUE CROSS/BLUE SHIELD | Source: Ambulatory Visit | Attending: Cardiology | Admitting: Cardiology

## 2017-06-24 ENCOUNTER — Other Ambulatory Visit: Payer: Self-pay

## 2017-06-24 ENCOUNTER — Encounter (HOSPITAL_COMMUNITY)
Admission: RE | Disposition: A | Discharge status: Home / Self Care | Payer: Self-pay | Source: Ambulatory Visit | Attending: Cardiology

## 2017-06-24 ENCOUNTER — Encounter (HOSPITAL_COMMUNITY): Payer: Self-pay | Admitting: General Practice

## 2017-06-24 DIAGNOSIS — E7800 Pure hypercholesterolemia, unspecified: Secondary | ICD-10-CM | POA: Diagnosis not present

## 2017-06-24 DIAGNOSIS — M1990 Unspecified osteoarthritis, unspecified site: Secondary | ICD-10-CM | POA: Insufficient documentation

## 2017-06-24 DIAGNOSIS — R0789 Other chest pain: Secondary | ICD-10-CM

## 2017-06-24 DIAGNOSIS — I25119 Atherosclerotic heart disease of native coronary artery with unspecified angina pectoris: Secondary | ICD-10-CM | POA: Diagnosis not present

## 2017-06-24 DIAGNOSIS — Z96649 Presence of unspecified artificial hip joint: Secondary | ICD-10-CM | POA: Diagnosis not present

## 2017-06-24 DIAGNOSIS — I209 Angina pectoris, unspecified: Secondary | ICD-10-CM | POA: Diagnosis not present

## 2017-06-24 DIAGNOSIS — G894 Chronic pain syndrome: Secondary | ICD-10-CM | POA: Insufficient documentation

## 2017-06-24 DIAGNOSIS — R002 Palpitations: Secondary | ICD-10-CM | POA: Diagnosis not present

## 2017-06-24 DIAGNOSIS — I10 Essential (primary) hypertension: Secondary | ICD-10-CM | POA: Insufficient documentation

## 2017-06-24 DIAGNOSIS — Z955 Presence of coronary angioplasty implant and graft: Secondary | ICD-10-CM

## 2017-06-24 DIAGNOSIS — Z951 Presence of aortocoronary bypass graft: Secondary | ICD-10-CM | POA: Diagnosis not present

## 2017-06-24 DIAGNOSIS — E785 Hyperlipidemia, unspecified: Secondary | ICD-10-CM | POA: Insufficient documentation

## 2017-06-24 DIAGNOSIS — R001 Bradycardia, unspecified: Secondary | ICD-10-CM | POA: Insufficient documentation

## 2017-06-24 DIAGNOSIS — I2510 Atherosclerotic heart disease of native coronary artery without angina pectoris: Secondary | ICD-10-CM | POA: Diagnosis present

## 2017-06-24 DIAGNOSIS — I259 Chronic ischemic heart disease, unspecified: Secondary | ICD-10-CM

## 2017-06-24 DIAGNOSIS — Z79899 Other long term (current) drug therapy: Secondary | ICD-10-CM | POA: Insufficient documentation

## 2017-06-24 DIAGNOSIS — R9439 Abnormal result of other cardiovascular function study: Secondary | ICD-10-CM

## 2017-06-24 HISTORY — DX: Essential (primary) hypertension: I10

## 2017-06-24 HISTORY — DX: Dorsalgia, unspecified: M54.9

## 2017-06-24 HISTORY — PX: CORONARY STENT INTERVENTION: CATH118234

## 2017-06-24 HISTORY — DX: Major depressive disorder, single episode, unspecified: F32.9

## 2017-06-24 HISTORY — DX: Bradycardia, unspecified: R00.1

## 2017-06-24 HISTORY — DX: Depression, unspecified: F32.A

## 2017-06-24 HISTORY — PX: LEFT HEART CATH AND CORS/GRAFTS ANGIOGRAPHY: CATH118250

## 2017-06-24 HISTORY — PX: CORONARY ANGIOPLASTY WITH STENT PLACEMENT: SHX49

## 2017-06-24 HISTORY — DX: Male erectile dysfunction, unspecified: N52.9

## 2017-06-24 HISTORY — DX: Other chronic pain: G89.29

## 2017-06-24 LAB — POCT ACTIVATED CLOTTING TIME
ACTIVATED CLOTTING TIME: 224 s
Activated Clotting Time: 274 seconds

## 2017-06-24 SURGERY — LEFT HEART CATH AND CORS/GRAFTS ANGIOGRAPHY
Anesthesia: LOCAL

## 2017-06-24 MED ORDER — IOPAMIDOL (ISOVUE-370) INJECTION 76%
INTRAVENOUS | Status: AC
  Filled 2017-06-24: qty 100

## 2017-06-24 MED ORDER — SODIUM CHLORIDE 0.9% FLUSH: 3.0000 mL | INTRAVENOUS | Status: DC | PRN

## 2017-06-24 MED ORDER — SERTRALINE HCL 50 MG PO TABS
50.0000 mg | ORAL_TABLET | Freq: Every day | ORAL | Status: DC
Start: 2017-06-24 — End: 2017-06-25
  Administered 2017-06-24 – 2017-06-25 (×2): 50 mg via ORAL
  Filled 2017-06-24 (×3): qty 1

## 2017-06-24 MED ORDER — SODIUM CHLORIDE 0.9 % IV SOLN: 250.0000 mL | INTRAVENOUS | Status: DC | PRN

## 2017-06-24 MED ORDER — ASPIRIN EC 81 MG PO TBEC
81.0000 mg | DELAYED_RELEASE_TABLET | Freq: Every day | ORAL | Status: DC
  Administered 2017-06-25: 09:00:00 81 mg via ORAL
  Filled 2017-06-24: qty 1

## 2017-06-24 MED ORDER — GABAPENTIN 600 MG PO TABS
600.0000 mg | ORAL_TABLET | Freq: Three times a day (TID) | ORAL | Status: DC
  Administered 2017-06-24 – 2017-06-25 (×3): 600 mg via ORAL
  Filled 2017-06-24 (×3): qty 1

## 2017-06-24 MED ORDER — CLOPIDOGREL BISULFATE 75 MG PO TABS
75.0000 mg | ORAL_TABLET | Freq: Every day | ORAL | Status: DC
  Administered 2017-06-25: 09:00:00 75 mg via ORAL
  Filled 2017-06-24: qty 1

## 2017-06-24 MED ORDER — NITROGLYCERIN 1 MG/10 ML FOR IR/CATH LAB
INTRA_ARTERIAL | Status: DC | PRN
  Administered 2017-06-24: 200 ug via INTRACORONARY

## 2017-06-24 MED ORDER — LABETALOL HCL 5 MG/ML IV SOLN: 10.0000 mg | INTRAVENOUS | Status: AC | PRN

## 2017-06-24 MED ORDER — DIAZEPAM 5 MG PO TABS: 10.0000 mg | ORAL_TABLET | ORAL | Status: DC

## 2017-06-24 MED ORDER — IOPAMIDOL (ISOVUE-370) INJECTION 76%
INTRAVENOUS | Status: DC | PRN
  Administered 2017-06-24: 175 mL via INTRA_ARTERIAL

## 2017-06-24 MED ORDER — FENTANYL CITRATE (PF) 100 MCG/2ML IJ SOLN
INTRAMUSCULAR | Status: DC | PRN
  Administered 2017-06-24: 25 ug via INTRAVENOUS

## 2017-06-24 MED ORDER — CLOPIDOGREL BISULFATE 300 MG PO TABS
ORAL_TABLET | ORAL | Status: AC
  Filled 2017-06-24: qty 2

## 2017-06-24 MED ORDER — HYDRALAZINE HCL 20 MG/ML IJ SOLN: 5.0000 mg | INTRAMUSCULAR | Status: AC | PRN

## 2017-06-24 MED ORDER — HEPARIN (PORCINE) IN NACL 2-0.9 UNIT/ML-% IJ SOLN
INTRAMUSCULAR | Status: AC | PRN
  Administered 2017-06-24: 1000 mL

## 2017-06-24 MED ORDER — ONDANSETRON HCL 4 MG/2ML IJ SOLN: 4.0000 mg | Freq: Four times a day (QID) | INTRAMUSCULAR | Status: DC | PRN

## 2017-06-24 MED ORDER — TIZANIDINE HCL 4 MG PO TABS
4.0000 mg | ORAL_TABLET | Freq: Every day | ORAL | Status: DC
  Administered 2017-06-24: 22:00:00 4 mg via ORAL
  Filled 2017-06-24: qty 1

## 2017-06-24 MED ORDER — MIDAZOLAM HCL 2 MG/2ML IJ SOLN
INTRAMUSCULAR | Status: DC | PRN
  Administered 2017-06-24: 1 mg via INTRAVENOUS

## 2017-06-24 MED ORDER — ICOSAPENT ETHYL 1 G PO CAPS: 1.0000 g | ORAL_CAPSULE | Freq: Two times a day (BID) | ORAL | Status: DC

## 2017-06-24 MED ORDER — VERAPAMIL HCL 2.5 MG/ML IV SOLN
INTRAVENOUS | Status: DC | PRN
  Administered 2017-06-24: 10 mL via INTRA_ARTERIAL

## 2017-06-24 MED ORDER — NITROGLYCERIN 1 MG/10 ML FOR IR/CATH LAB
INTRA_ARTERIAL | Status: AC
  Filled 2017-06-24: qty 10

## 2017-06-24 MED ORDER — SODIUM CHLORIDE 0.9 % WEIGHT BASED INFUSION: 1.0000 mL/kg/h | INTRAVENOUS | Status: AC

## 2017-06-24 MED ORDER — VERAPAMIL HCL 2.5 MG/ML IV SOLN
INTRAVENOUS | Status: AC
  Filled 2017-06-24: qty 2

## 2017-06-24 MED ORDER — CLOPIDOGREL BISULFATE 300 MG PO TABS
ORAL_TABLET | ORAL | Status: DC | PRN
  Administered 2017-06-24: 600 mg via ORAL

## 2017-06-24 MED ORDER — ACETAMINOPHEN 325 MG PO TABS: 650.0000 mg | ORAL_TABLET | ORAL | Status: DC | PRN

## 2017-06-24 MED ORDER — FENTANYL CITRATE (PF) 100 MCG/2ML IJ SOLN
INTRAMUSCULAR | Status: AC
  Filled 2017-06-24: qty 2

## 2017-06-24 MED ORDER — HEPARIN SODIUM (PORCINE) 1000 UNIT/ML IJ SOLN
INTRAMUSCULAR | Status: AC
  Filled 2017-06-24: qty 1

## 2017-06-24 MED ORDER — IOPAMIDOL (ISOVUE-370) INJECTION 76%
INTRAVENOUS | Status: AC
  Filled 2017-06-24: qty 125

## 2017-06-24 MED ORDER — HEPARIN (PORCINE) IN NACL 2-0.9 UNIT/ML-% IJ SOLN
INTRAMUSCULAR | Status: AC
  Filled 2017-06-24: qty 1000

## 2017-06-24 MED ORDER — LIDOCAINE HCL (PF) 1 % IJ SOLN
INTRAMUSCULAR | Status: DC | PRN
  Administered 2017-06-24: 2 mL via SUBCUTANEOUS

## 2017-06-24 MED ORDER — ROSUVASTATIN CALCIUM 5 MG PO TABS
5.0000 mg | ORAL_TABLET | Freq: Every evening | ORAL | Status: DC
  Administered 2017-06-24: 17:00:00 5 mg via ORAL
  Filled 2017-06-24: qty 1

## 2017-06-24 MED ORDER — METOPROLOL TARTRATE 25 MG PO TABS
25.0000 mg | ORAL_TABLET | Freq: Two times a day (BID) | ORAL | Status: DC
  Administered 2017-06-24 – 2017-06-25 (×2): 25 mg via ORAL
  Filled 2017-06-24 (×3): qty 1

## 2017-06-24 MED ORDER — SODIUM CHLORIDE 0.9% FLUSH: 3.0000 mL | Freq: Two times a day (BID) | INTRAVENOUS | Status: DC

## 2017-06-24 MED ORDER — SODIUM CHLORIDE 0.9 % IV SOLN
INTRAVENOUS | Status: DC
  Administered 2017-06-24: 12:00:00 via INTRAVENOUS

## 2017-06-24 MED ORDER — MIDAZOLAM HCL 2 MG/2ML IJ SOLN
INTRAMUSCULAR | Status: AC
  Filled 2017-06-24: qty 2

## 2017-06-24 MED ORDER — HEPARIN SODIUM (PORCINE) 1000 UNIT/ML IJ SOLN
INTRAMUSCULAR | Status: DC | PRN
  Administered 2017-06-24: 4000 [IU] via INTRAVENOUS
  Administered 2017-06-24: 4500 [IU] via INTRAVENOUS
  Administered 2017-06-24: 2000 [IU] via INTRAVENOUS

## 2017-06-24 MED ORDER — ASPIRIN 81 MG PO CHEW
CHEWABLE_TABLET | ORAL | Status: AC
  Administered 2017-06-24: 81 mg
  Filled 2017-06-24: qty 1

## 2017-06-24 MED ORDER — ASPIRIN 81 MG PO CHEW: 81.0000 mg | CHEWABLE_TABLET | ORAL | Status: DC

## 2017-06-24 SURGICAL SUPPLY — 21 items
BALLN SAPPHIRE 2.5X12 (BALLOONS) ×2
BALLN SAPPHIRE ~~LOC~~ 2.5X15 (BALLOONS) ×1 IMPLANT
BALLN SAPPHIRE ~~LOC~~ 3.75X15 (BALLOONS) ×1 IMPLANT
BALLOON SAPPHIRE 2.5X12 (BALLOONS) IMPLANT
CATH INFINITI 5 FR JL3.5 (CATHETERS) ×1 IMPLANT
CATH INFINITI 5FR ANG PIGTAIL (CATHETERS) ×1 IMPLANT
CATH INFINITI JR4 5F (CATHETERS) ×1 IMPLANT
CATH LAUNCHER 6FR EBU3.5 (CATHETERS) ×1 IMPLANT
DEVICE RAD COMP TR BAND LRG (VASCULAR PRODUCTS) ×1 IMPLANT
GLIDESHEATH SLEND SS 6F .021 (SHEATH) ×1 IMPLANT
GUIDEWIRE INQWIRE 1.5J.035X260 (WIRE) IMPLANT
INQWIRE 1.5J .035X260CM (WIRE) ×2
KIT ENCORE 26 ADVANTAGE (KITS) ×1 IMPLANT
KIT HEART LEFT (KITS) ×2 IMPLANT
PACK CARDIAC CATHETERIZATION (CUSTOM PROCEDURE TRAY) ×2 IMPLANT
STENT SYNERGY DES 2.5X20 (Permanent Stent) ×1 IMPLANT
STENT SYNERGY DES 3.5X20 (Permanent Stent) ×1 IMPLANT
SYR MEDRAD MARK V 150ML (SYRINGE) ×2 IMPLANT
TRANSDUCER W/STOPCOCK (MISCELLANEOUS) ×2 IMPLANT
TUBING CIL FLEX 10 FLL-RA (TUBING) ×2 IMPLANT
WIRE ASAHI PROWATER 180CM (WIRE) ×1 IMPLANT

## 2017-06-24 NOTE — Interval H&P Note (Signed)
History and Physical Interval Note:  06/24/2017 12:27 PM  Jared [name]  has presented today for surgery, with the diagnosis of abn stress  The various methods of treatment have been discussed with the patient and family. After consideration of risks, benefits and other options for treatment, the patient has consented to  Procedure(s): LEFT HEART CATH AND CORS/GRAFTS ANGIOGRAPHY (N/A) as a surgical intervention .  The patient's history has been reviewed, patient examined, no change in status, stable for surgery.  I have reviewed the patient's chart and labs.  Questions were answered to the patient's satisfaction.   Cath Lab Visit (complete for each Cath Lab visit)  Clinical Evaluation Leading to the Procedure:   ACS: No.  Non-ACS:    Anginal Classification: CCS II  Anti-ischemic medical therapy: Minimal Therapy (1 class of medications)  Non-Invasive Test Results: Intermediate-risk stress test findings: cardiac mortality 1-3%/year  Prior CABG: Previous CABG        Theron Aristaeter [facility] HospitalJordanMD,FACC 06/24/2017 12:27 PM

## 2017-06-25 ENCOUNTER — Encounter (HOSPITAL_COMMUNITY): Payer: Self-pay | Admitting: Cardiology

## 2017-06-25 DIAGNOSIS — E785 Hyperlipidemia, unspecified: Secondary | ICD-10-CM | POA: Diagnosis not present

## 2017-06-25 DIAGNOSIS — I25119 Atherosclerotic heart disease of native coronary artery with unspecified angina pectoris: Secondary | ICD-10-CM | POA: Diagnosis not present

## 2017-06-25 DIAGNOSIS — I10 Essential (primary) hypertension: Secondary | ICD-10-CM

## 2017-06-25 DIAGNOSIS — I2510 Atherosclerotic heart disease of native coronary artery without angina pectoris: Secondary | ICD-10-CM

## 2017-06-25 DIAGNOSIS — I209 Angina pectoris, unspecified: Secondary | ICD-10-CM | POA: Diagnosis not present

## 2017-06-25 DIAGNOSIS — Z96649 Presence of unspecified artificial hip joint: Secondary | ICD-10-CM | POA: Diagnosis not present

## 2017-06-25 DIAGNOSIS — G894 Chronic pain syndrome: Secondary | ICD-10-CM | POA: Diagnosis not present

## 2017-06-25 DIAGNOSIS — Z79899 Other long term (current) drug therapy: Secondary | ICD-10-CM | POA: Diagnosis not present

## 2017-06-25 DIAGNOSIS — R001 Bradycardia, unspecified: Secondary | ICD-10-CM | POA: Diagnosis not present

## 2017-06-25 DIAGNOSIS — R002 Palpitations: Secondary | ICD-10-CM | POA: Diagnosis not present

## 2017-06-25 DIAGNOSIS — Z951 Presence of aortocoronary bypass graft: Secondary | ICD-10-CM | POA: Diagnosis not present

## 2017-06-25 DIAGNOSIS — M1990 Unspecified osteoarthritis, unspecified site: Secondary | ICD-10-CM | POA: Diagnosis not present

## 2017-06-25 DIAGNOSIS — R9439 Abnormal result of other cardiovascular function study: Secondary | ICD-10-CM

## 2017-06-25 DIAGNOSIS — E7800 Pure hypercholesterolemia, unspecified: Secondary | ICD-10-CM | POA: Diagnosis not present

## 2017-06-25 LAB — BASIC METABOLIC PANEL
Anion gap: 9 (ref 5–15)
BUN: 9 mg/dL (ref 6–20)
CALCIUM: 8.6 mg/dL — AB (ref 8.9–10.3)
CO2: 24 mmol/L (ref 22–32)
CREATININE: 0.71 mg/dL (ref 0.61–1.24)
Chloride: 107 mmol/L (ref 101–111)
GFR calc non Af Amer: >60 mL/min (ref >60)
Glucose, Bld: 108 mg/dL — ABNORMAL HIGH (ref 65–99)
Potassium: 3.6 mmol/L (ref 3.5–5.1)
SODIUM: 140 mmol/L (ref 135–145)

## 2017-06-25 LAB — CBC
HCT: 42.7 % (ref 39.0–52.0)
Hemoglobin: 14.3 g/dL (ref 13.0–17.0)
MCH: 30.8 pg (ref 26.0–34.0)
MCHC: 33.5 g/dL (ref 30.0–36.0)
MCV: 91.8 fL (ref 78.0–100.0)
PLATELETS: 218 10*3/uL (ref 150–400)
RBC: 4.65 MIL/uL (ref 4.22–5.81)
RDW: 12.3 % (ref 11.5–15.5)
WBC: 7.4 10*3/uL (ref 4.0–10.5)

## 2017-06-25 MED ORDER — ANGIOPLASTY BOOK
Freq: Once | Status: AC
  Administered 2017-06-25: 07:00:00
  Filled 2017-06-25: qty 1

## 2017-06-25 MED ORDER — CLOPIDOGREL BISULFATE 75 MG PO TABS: 75.0000 mg | ORAL_TABLET | Freq: Every day | ORAL | 3 refills | Status: DC

## 2017-06-25 NOTE — Progress Notes (Signed)
CARDIAC REHAB PHASE I   PRE:  Rate/Rhythm: 69 SR  BP:  Supine:   Sitting: 141/65  Standing:    SaO2: 96%RA  MODE:  Ambulation: 500 ft   POST:  Rate/Rhythm: 87 SR  BP:  Supine:   Sitting: 130/74  Standing:    SaO2: 96%RA [id number] Pt walked 500 ft on RA with steady gait and no CP. Tolerated well. Education completed with pt who voiced understanding. Stressed importance of plavix with stent. Reviewed NTG use, risk factors, gave heart healthy diet and ex ed. Will [facility] to [facility] 2. Unsure if pt will be able to attend as he works out of town a lot. Cut bed alarm off and notified RN as pt steady when walking.   Luetta Nuttingharlene Donaciano Range, RN BSN  06/25/2017 8:37 AM

## 2017-06-25 NOTE — Progress Notes (Signed)
Progress Note  Patient Name: Jared [name] Date of Encounter: 06/25/2017  Primary Cardiologist: Dr. Elease Hashimoto  Subjective    Feeling great this AM. No CP or SOB. No throat pain. Has not been out of bed yet. No dizziness or syncope.  Inpatient Medications    Scheduled Meds: . aspirin EC  81 mg Oral Daily  . clopidogrel  75 mg Oral Q breakfast  . gabapentin  600 mg Oral TID  . Icosapent Ethyl  1 g Oral BID  . metoprolol tartrate  25 mg Oral BID  . rosuvastatin  5 mg Oral QPM  . sertraline  50 mg Oral Daily  . sodium chloride flush  3 mL Intravenous Q12H  . tiZANidine  4 mg Oral QHS   Continuous Infusions: . sodium chloride     PRN Meds: sodium chloride, acetaminophen, ondansetron (ZOFRAN) IV, sodium chloride flush   Vital Signs    Vitals:   06/25/17 0000 06/25/17 0200 06/25/17 0400 06/25/17 0505  BP: 98/68 (!) 83/42 (!) 93/40 130/68  Pulse: 61 (!) 53 (!) 51 (!) 56  Resp: 13 14 15 14   Temp:    97.9 F (36.6 C)  TempSrc:    Oral  SpO2: 95% 95% 96% 98%  Weight:    190 lb 4.1 oz (86.3 kg)  Height:        Intake/Output Summary (Last 24 hours) at 06/25/2017 0726 Last data filed at 06/25/2017 0508 Gross per 24 hour  Intake 1586.31 ml  Output 1000 ml  Net 586.31 ml   Filed Weights   06/24/17 1120 06/25/17 0505  Weight: 190 lb (86.2 kg) 190 lb 4.1 oz (86.3 kg)    Telemetry    NSR 50s-60s (nocturnal into the upper 40s) - Personally Reviewed  ECG    SB no acute ST-T changes  - Personally Reviewed  Physical Exam   GEN: No acute distress well appearing WM HEENT: Normocephalic, atraumatic, sclera non-icteric. Neck: No JVD or bruits. Cardiac: RRR no murmurs, rubs, or gallops.  Radials/DP/PT 1+ and equal bilaterally.  Respiratory: Clear to auscultation bilaterally. Breathing is unlabored. GI: Soft, nontender, non-distended, BS +x 4. MS: no deformity. Extremities: No clubbing or cyanosis. No edema. Distal pedal pulses are 2+ and equal bilaterally. Left radial cath  site without hematoma or ecchymosis; good pulse. Neuro:  AAOx3. Follows commands. Psych:  Responds to questions appropriately with a normal affect.  Labs    Chemistry Recent Labs  Lab 06/22/17 0929 06/25/17 0443  NA 141 140  K 4.1 3.6  CL 100 107  CO2 24 24  GLUCOSE 132* 108*  BUN 15 9  CREATININE 0.76 0.71  CALCIUM 8.9 8.6*  PROT 7.0  --   ALBUMIN 4.6  --   AST 23  --   ALT 27  --   ALKPHOS 66  --   BILITOT 0.4  --   GFRNONAA 97 >60  GFRAA 112 >60  ANIONGAP  --  9     Hematology Recent Labs  Lab 06/22/17 0929 06/25/17 0443  WBC 8.3 7.4  RBC 4.81 4.65  HGB 15.1 14.3  HCT 43.3 42.7  MCV 90 91.8  MCH 31.4 30.8  MCHC 34.9 33.5  RDW 12.5 12.3  PLT 277 218    Radiology    No results found.  Cardiac Studies   Procedures   CORONARY STENT INTERVENTION  LEFT HEART CATH AND CORS/GRAFTS ANGIOGRAPHY  Conclusion     Prox RCA lesion is 95% stenosed.  Ost LAD  to Prox LAD lesion is 100% stenosed.  Ost Ramus to Ramus lesion is 90% stenosed.  A drug-eluting stent was successfully placed using a STENT SYNERGY DES 2.5X20.  Post intervention, there is a 0% residual stenosis.  Prox Cx lesion is 95% stenosed.  Post intervention, there is a 0% residual stenosis.  A drug-eluting stent was successfully placed using a STENT SYNERGY DES 3.5X20.  SVG graft was visualized by angiography and is normal in caliber.  The graft exhibits no disease.  LIMA graft was visualized by angiography and is normal in caliber.  The graft exhibits no disease.  The left ventricular systolic function is normal.  LV end diastolic pressure is normal.  The left ventricular ejection fraction is 55-65% by visual estimate.   1. Severe 3 vessel obstructive CAD 2. Patent LIMA to the LAD 3. Patent SVG to the first diagonal 4. Normal LV function 5. Normal LVEDP 6. Successful PCI of the mid LCx- dominant vessel with DES 7. Successful PCI of the proximal ramus intermediate with  DES  Plan: DAPT for one year. Anticipate DC in am. The nondominant RCA is too small for PCI and should be managed medically.      Patient Profile     62 y.o. male with CAD s/p CABG 2009, ED, HTN, HLD, OA, chronic pain, resting bradycardia who was recently seen in the office with throat pain and intermittent palpitations, found to have intermediate risk stress test. Cardiac cath 06/24/17 showed patent LIMA-LAD, patent SVG-D1, normal LVEF/LVEDP, s/p DES to mid LCx and DES to prox ramus intermediate; nondominant RCA with recommendation to treat medically.  Assessment & Plan    1. CAD - s/p PCI yesterday, doing well today. Cardiac rehab to see today. Anticipate discharge today with f/u already scheduled 2/11. Will review home pain regimen with Dr. Elease HashimotoNahser as this includes Mobic and naproxen.  2. HTN - BP soft overnight, but high yesterday and normal this AM. Follow with ambulation.  3. Hyperlipidemia - recent LDL was 41. He is only on low dose Crestor and icosapent as he has prior issues with Zocor. This can be followed further as OP.  4. Sinus bradycardia - asymptomatic.  Has f/u with Lawson FiscalLori 2/11 at 8:30am.  For questions or updates, please contact [facility] Please consult [email or web address] for contact info under Cardiology/STEMI.  Signed, Laurann Montanaayna N Dunn, PA-C 06/25/2017, 7:26 AM    Attending Note:   The patient was seen and examined.  Agree with assessment and plan as noted above.  Changes made to the above note as needed.  Patient seen and independently examined with Ronie Spiesayna Dunn, PA .   We discussed all aspects of the encounter. I agree with the assessment and plan as stated above.  1.   CAD :   S/p stening of LCX and ramus Doing well.  No further episodes of throat burning. Ambulated without any proplems Left radial cath site is stabl    I have spent a total of 40 minutes with patient reviewing hospital  notes , telemetry, EKGs, labs and examining patient as well as establishing  an assessment and plan that was discussed with the patient. > 50% of time was spent in direct patient care.    Vesta MixerPhilip J. Michaeal Davis, Montez HagemanJr., MD, [facility], [place] FranciscoFACC 06/25/2017, 9:28 AM [address],  [address] Office [phone]- [phone] Pager [phone]- [phone]

## 2017-06-25 NOTE — Discharge Summary (Signed)
Discharge Summary    Patient ID: Jared [name],  MRN: [id number], DOB/AGE: [birth year] 62 y.o.  Admit date: 06/24/2017 Discharge date: 06/25/2017  Primary Care Provider: Gaspar Garbe Primary Cardiologist: Dr. Elease Hashimoto  Discharge Diagnoses    Principal Problem:   Angina pectoris [facility]) Active Problems:   CAD (coronary artery disease)   Hyperlipidemia   Abnormal stress test   Essential hypertension   Diagnostic Studies/Procedures    Procedures   CORONARY STENT INTERVENTION  LEFT HEART CATH AND CORS/GRAFTS ANGIOGRAPHY  Conclusion     Prox RCA lesion is 95% stenosed.  Ost LAD to Prox LAD lesion is 100% stenosed.  Ost Ramus to Ramus lesion is 90% stenosed.  A drug-eluting stent was successfully placed using a STENT SYNERGY DES 2.5X20.  Post intervention, there is a 0% residual stenosis.  Prox Cx lesion is 95% stenosed.  Post intervention, there is a 0% residual stenosis.  A drug-eluting stent was successfully placed using a STENT SYNERGY DES 3.5X20.  SVG graft was visualized by angiography and is normal in caliber.  The graft exhibits no disease.  LIMA graft was visualized by angiography and is normal in caliber.  The graft exhibits no disease.  The left ventricular systolic function is normal.  LV end diastolic pressure is normal.  The left ventricular ejection fraction is 55-65% by visual estimate.   1. Severe 3 vessel obstructive CAD 2. Patent LIMA to the LAD 3. Patent SVG to the first diagonal 4. Normal LV function 5. Normal LVEDP 6. Successful PCI of the mid LCx- dominant vessel with DES 7. Successful PCI of the proximal ramus intermediate with DES  Plan: DAPT for one year. Anticipate DC in am. The nondominant RCA is too small for PCI and should be managed medically.       _____________     History of Present Illness     Jared [name] is a 62 y.o. male with history of CAD s/p CABG 2009, ED, HTN, HLD, OA, chronic pain, resting  bradycardia who was recently seen in the office with throat pain and intermittent palpitations, found to have intermediate risk stress test.   Hospital Course    1. CAD/recent angina pectoris - Cardiac cath 06/24/17 showed patent LIMA-LAD, patent SVG-D1, normal LVEF/LVEDP, s/p DES to mid LCx and DES to prox ramus intermediate; nondominant RCA with recommendation to treat medically. s/p PCI yesterday, doing well today. Started on Plavix with recommendation to continue DAPT for 1 year. Cardiac rehab to see today. Anticipate discharge today with f/u already scheduled 2/11. Per review with Dr. Elease Hashimoto, he may continue on his Mobic and PRN naproxen but he was advised to limit if at all possible given bleeding/CV risk, and talk to primary provider about alternatives. Dr. Elease Hashimoto feels these might be necessary because the patient will not be able to resume back injections for at least 6 months. Bleeding precautions reviewed.  2. HTN - BP soft overnight, but high yesterday and normal this AM. Follow with ambulation.  3. Hyperlipidemia - recent LDL was 41. He is only on low dose Crestor and icosapent as he has prior issues with Zocor. This can be followed further as OP.  4. Sinus bradycardia - asymptomatic.  Dr. Elease Hashimoto has seen and examined the patient today and feels he is stable for discharge.  _____________  Discharge Vitals Blood pressure 130/74, pulse 67, temperature 98.1 F (36.7 C), temperature source Oral, resp. rate 15, height 5\' 8"  (1.727 m), weight 190 lb  4.1 oz (86.3 kg), SpO2 96 %.  Filed Weights   06/24/17 1120 06/25/17 0505  Weight: 190 lb (86.2 kg) 190 lb 4.1 oz (86.3 kg)    Labs & Radiologic Studies    CBC Recent Labs    06/25/17 0443  WBC 7.4  HGB 14.3  HCT 42.7  MCV 91.8  PLT 218   Basic Metabolic Panel Recent Labs    65/78/46 0443  NA 140  K 3.6  CL 107  CO2 24  GLUCOSE 108*  BUN 9  CREATININE 0.71  CALCIUM 8.6*   Liver Function Tests No results for  input(s): AST, ALT, ALKPHOS, BILITOT, PROT, ALBUMIN in the last 72 hours. Fasting Lipid Panel No results for input(s): CHOL, HDL, LDLCALC, TRIG, CHOLHDL, LDLDIRECT in the last 72 hours. _____________  Dg Chest 2 View  Result Date: 06/22/2017 CLINICAL DATA:  Preoperative examination prior to cardiac catheterization on June 24, 2016. Abnormal stress test. Chest discomfort for the past 6 months. Previous CABG. Nonsmoker. EXAM: CHEST  2 VIEW COMPARISON:  Chest x-ray of June 16, 2008 FINDINGS: The lungs are well-expanded and clear. The heart and pulmonary vascularity are normal. There are post CABG changes which appears stable. The trachea is midline. There is mild multilevel degenerative disc disease of the thoracic spine. IMPRESSION: There is no pneumonia, CHF, nor other active cardiopulmonary disease. Previous CABG. Electronically Signed   By: David  [place] M.D.   On: 06/22/2017 14:08   Disposition   Pt is being discharged home today in good condition.  Follow-up Plans & Appointments    Follow-up Information    Rosalio Macadamia, NP Follow up.   Specialties:  Nurse Practitioner, Interventional Cardiology, Cardiology, Radiology Why:  [facility] - keep follow up with [facility] as scheduled 2/11 at 8:30am. Contact information: [address] [place] [address] [phone]          Discharge Instructions    AMB Referral to Cardiac Rehabilitation - Phase II   Complete by:  As directed    Diagnosis:  Coronary Stents   Amb Referral to Cardiac Rehabilitation   Complete by:  As directed    Referring to [facility] Phase 2   Diagnosis:  Coronary Stents   Diet - low sodium heart healthy   Complete by:  As directed    Discharge instructions   Complete by:  As directed    You were started on a new medicine called Plavix (clopidogrel).  Patients taking blood thinners or with cardiovascular disease should try to limit medicines in the "NSAID" class as much as possible.  This includes medications like Mobic, meloxicam, ibuprofen, Advil, Motrin, naproxen, and Aleve. Dr. Elease Hashimoto feels you may continue on your Mobic if absolutely necessary but to limit any additional intake due to the risk of stomach bleeding or heart risk. You may also take Tylenol as directed or talk to your primary doctor about alternatives. If you notice any bleeding such as blood in stool, black tarry stools, blood in urine, nosebleeds or any other unusual bleeding, call your doctor immediately.   Increase activity slowly   Complete by:  As directed    No driving for 2 days. No lifting over 5 lbs for 1 week. No sexual activity for 1 week. Keep procedure site clean & dry. If you notice increased pain, swelling, bleeding or pus, call/return!  You may shower, but no soaking baths/hot tubs/pools for 1 week.      Discharge Medications   Allergies as  of 06/25/2017      Reactions   Zocor [simvastatin] Other (See Comments)   myalgias      Medication List    TAKE these medications   aspirin EC 81 MG tablet Take 1 tablet (81 mg total) by mouth daily.   clopidogrel 75 MG tablet Commonly known as:  PLAVIX Take 1 tablet (75 mg total) by mouth daily.   gabapentin 600 MG tablet Commonly known as:  NEURONTIN Take 600 mg by mouth 3 (three) times daily.   meloxicam 15 MG tablet Commonly known as:  MOBIC Take 15 mg by mouth daily.   metoprolol tartrate 25 MG tablet Commonly known as:  LOPRESSOR Take 25 mg by mouth 2 (two) times daily.   naproxen sodium 220 MG tablet Commonly known as:  ALEVE Take 220 mg by mouth daily as needed (for pain).   nitroGLYCERIN 0.4 MG SL tablet Commonly known as:  NITROSTAT Place 1 tablet (0.4 mg total) under the tongue every 5 (five) minutes as needed for chest pain.   rosuvastatin 5 MG tablet Commonly known as:  CRESTOR Take 1 tablet (5 mg total) by mouth daily. What changed:  when to take this   sertraline 50 MG tablet Commonly known as:  ZOLOFT Take 50  mg by mouth daily.   tiZANidine 4 MG tablet Commonly known as:  ZANAFLEX Take 4 mg by mouth at bedtime.   VASCEPA 1 g Caps Generic drug:  Icosapent Ethyl Take 1 g by mouth 2 (two) times daily.        Allergies:  Allergies  Allergen Reactions  . Zocor [Simvastatin] Other (See Comments)    myalgias     Outstanding Labs/Studies   N/A  Duration of Discharge Encounter   Greater than 30 minutes including physician time.  Signed, Laurann Montanaayna N Dunn PA-C 06/25/2017, 9:43 AM   Attending Note:   The patient was seen and examined.  Agree with assessment and plan as noted above.  Changes made to the above note as needed.  Patient seen and independently examined with Ronie Spiesayna Dunn, PA .   We discussed all aspects of the encounter. I agree with the assessment and plan as stated above.  1.    CAD - Patient is s/p PCI of LCx and Ramus Int. He is doing well. Continue current meds.  plavix and ASA for 1 year    I have spent a total of 40 minutes with patient reviewing hospital  notes , telemetry, EKGs, labs and examining patient as well as establishing an assessment and plan that was discussed with the patient. > 50% of time was spent in direct patient care.    Vesta MixerPhilip J. Ebrima Ranta, Montez HagemanJr., MD, [facility] HospitalFACC 06/25/2017, 3:52 PM [address],  [address] Office [phone]- [phone] Pager [phone]- [phone]

## 2017-06-25 NOTE — Plan of Care (Signed)
  Clinical Measurements: Ability to maintain clinical measurements within normal limits will improve 06/25/2017 0156 - Completed/Met by Angelica Pou, Trilby Drummer, RN Will remain free from infection 06/25/2017 0156 - Completed/Met by Tish Frederickson, RN Diagnostic test results will improve 06/25/2017 0156 - Completed/Met by Tish Frederickson, RN Respiratory complications will improve 06/25/2017 0156 - Completed/Met by Tish Frederickson, RN Cardiovascular complication will be avoided 06/25/2017 0156 - Completed/Met by Angelica Pou, Trilby Drummer, RN   Activity: Risk for activity intolerance will decrease 06/25/2017 0156 - Completed/Met by Angelica Pou, Trilby Drummer, RN   Coping: Level of anxiety will decrease 06/25/2017 0156 - Completed/Met by Angelica Pou, Trilby Drummer, RN   Elimination: Will not experience complications related to bowel motility 06/25/2017 0156 - Completed/Met by Tish Frederickson, RN Will not experience complications related to urinary retention 06/25/2017 0156 - Completed/Met by Tish Frederickson, RN   Pain Managment: General experience of comfort will improve 06/25/2017 0156 - Completed/Met by Tish Frederickson, RN   Safety: Ability to remain free from injury will improve 06/25/2017 0156 - Completed/Met by Tish Frederickson, RN   Safety: Ability to remain free from injury will improve 06/25/2017 0156 - Completed/Met by Tish Frederickson, RN   Skin Integrity: Risk for impaired skin integrity will decrease 06/25/2017 0156 - Completed/Met by Tish Frederickson, RN   Education: Understanding of CV disease, CV risk reduction, and recovery process will improve 06/25/2017 0156 - Completed/Met by Tish Frederickson, RN   Activity: Ability to return to baseline activity level will improve 06/25/2017 0156 - Completed/Met by Tish Frederickson, RN   Cardiovascular: Vascular access site(s) Level 0-1 will be maintained 06/25/2017 0156 - Completed/Met by Tish Frederickson, RN    Education: Understanding of CV disease, CV risk reduction, and recovery process will improve 06/25/2017 0156 - Completed/Met by Tish Frederickson, RN

## 2017-07-06 ENCOUNTER — Telehealth (HOSPITAL_COMMUNITY): Payer: Self-pay

## 2017-07-06 ENCOUNTER — Encounter: Payer: Self-pay | Admitting: Nurse Practitioner

## 2017-07-06 ENCOUNTER — Ambulatory Visit: Payer: BLUE CROSS/BLUE SHIELD | Admitting: Nurse Practitioner

## 2017-07-06 VITALS — BP 118/82 | HR 56 | Ht 68.0 in | Wt 188.8 lb

## 2017-07-06 DIAGNOSIS — I259 Chronic ischemic heart disease, unspecified: Secondary | ICD-10-CM | POA: Diagnosis not present

## 2017-07-06 DIAGNOSIS — I10 Essential (primary) hypertension: Secondary | ICD-10-CM

## 2017-07-06 DIAGNOSIS — Z955 Presence of coronary angioplasty implant and graft: Secondary | ICD-10-CM

## 2017-07-06 DIAGNOSIS — E7849 Other hyperlipidemia: Secondary | ICD-10-CM | POA: Diagnosis not present

## 2017-07-06 LAB — BASIC METABOLIC PANEL
BUN/Creatinine Ratio: 16 (ref 10–24)
BUN: 12 mg/dL (ref 8–27)
CO2: 23 mmol/L (ref 20–29)
Calcium: 9.6 mg/dL (ref 8.6–10.2)
Chloride: 102 mmol/L (ref 96–106)
Creatinine, Ser: 0.76 mg/dL (ref 0.76–1.27)
GFR calc Af Amer: 112 mL/min/{1.73_m2} (ref >59)
GFR calc non Af Amer: 97 mL/min/{1.73_m2} (ref >59)
Glucose: 112 mg/dL — ABNORMAL HIGH (ref 65–99)
Potassium: 4.3 mmol/L (ref 3.5–5.2)
Sodium: 139 mmol/L (ref 134–144)

## 2017-07-06 LAB — CBC
Hematocrit: 44 % (ref 37.5–51.0)
Hemoglobin: 15.1 g/dL (ref 13.0–17.7)
MCH: 30.8 pg (ref 26.6–33.0)
MCHC: 34.3 g/dL (ref 31.5–35.7)
MCV: 90 fL (ref 79–97)
Platelets: 284 10*3/uL (ref 150–379)
RBC: 4.9 x10E6/uL (ref 4.14–5.80)
RDW: 12.6 % (ref 12.3–15.4)
WBC: 7.4 10*3/uL (ref 3.4–10.8)

## 2017-07-06 NOTE — Progress Notes (Signed)
CARDIOLOGY OFFICE NOTE  Date:  07/06/2017    Jared [name] Date of Birth: 12-14-1955 Medical Record #[id number]  PCP:  Gaspar Garbeisovec, Richard W, MD  Cardiologist:  Tyrone SageGerhardt & Nahser  Chief Complaint  Patient presents with  . Coronary Artery Disease    Post PCI visit - seen for Dr. Elease HashimotoNahser    History of Present Illness: Jared [name] is a 62 y.o. male who presents today for a post hospital visit. Seen for Dr. Elease HashimotoNahser. Former patient of Dr. Ronnald Nianennant's.   He has known CAD with prior CABG in 2009 with LIMA to LAD and SVG to DX.  Intermediate vessel too small to bypass. His surgery was in [place]. Other issues include ED, HTN and HLD. No prior history of MI noted.   Seenin November of 2018 - some chest pain reported - somewhat similar to prior chest pain syndrome - referred for Myoview - this was abnormal. He was working out of town and was not able to come back for follow up until last last month - we then referred on for cardiac cath.    Comes back today. Here with a friend today. He is doing better. No more chest pain. Says he didn't realize how bad he had felt. Tolerating his medicines. He stopped his Mobic due to being on DAPT. He is to go to the pain clinic next week - ?for nerve stimulator to block the nerves in his lower back - was told that this could be done on Plavix without interruption.   Noted from Care Everywhere:   "Telephone Encounter - Daryel NovemberLatham, Michele E, RN - 06/30/2017 2:38 PM EST Spoke with wife regarding upcoming procedure. Pt scheduled for Bil L3-S1 MBB on 07/13/17. Informed jpt's wife it is not necessary to hold anticoagulant for Lumbar MBB. Pt/wife states understanding.  Electronically signed by: Daryel NovemberMichele E Latham, RN 06/30/17 1439"   Past Medical History:  Diagnosis Date  . Chronic back pain   . Coronary artery disease    a. CABGx2 Left internal mammary artery to the LAD and a saphenous vein graft to the first diagonal with apparent intermediate coronary that  was felt to be too small to bypass. b. Abnl stress test 05/2017 s/p DES to mid LCx, DES to ramus intermediate, nondominant RCA too small for PCI (med rx), normal EF.  Marland Kitchen. Depression   . ED (erectile dysfunction)   . Fatigue   . Hyperlipidemia   . Hypertension   . Osteoarthritis    "qwhere" (06/24/2017)  . Sinus bradycardia     Past Surgical History:  Procedure Laterality Date  . CARDIAC CATHETERIZATION  04/18/2008   severe stenosis in the left anterior descending and what was described at that point as the first and second diagonal vessels.  The right coronary artery was nondominant .  The left circumfles had what was felt to be moderate sesions in its midportions  . CORONARY ANGIOPLASTY WITH STENT PLACEMENT  06/24/2017  . CORONARY ARTERY BYPASS GRAFT  04/19/2008   x2 -- EF of 70% -- left internal mammary artery to the LAD and a saphenous vein graft to the first diagonal with apparent intermediate coronary that was felt to be too small to bypass  . CORONARY STENT INTERVENTION N/A 06/24/2017   Procedure: CORONARY STENT INTERVENTION;  Surgeon: SwazilandJordan, Peter M, MD;  [facility];  Service: Cardiovascular;  Laterality: N/A;  . JOINT REPLACEMENT    . LEFT HEART CATH AND CORS/GRAFTS ANGIOGRAPHY N/A 06/24/2017  Procedure: LEFT HEART CATH AND CORS/GRAFTS ANGIOGRAPHY;  Surgeon: Swaziland, Peter M, MD;  Location: [facility] INVASIVE CV LAB;  Service: Cardiovascular;  Laterality: N/A;  . REVISION TOTAL HIP ARTHROPLASTY Left 01/2014   "@ [address]"  . TOTAL HIP ARTHROPLASTY Left 12/2005     Medications: Current Meds  Medication Sig  . acetaminophen (TYLENOL) 500 MG tablet Take 1,000 mg by mouth every 6 (six) hours as needed for mild pain (back).  Marland Kitchen aspirin EC 81 MG tablet Take 1 tablet (81 mg total) by mouth daily.  . clopidogrel (PLAVIX) 75 MG tablet Take 1 tablet (75 mg total) by mouth daily.  Marland Kitchen gabapentin (NEURONTIN) 600 MG tablet Take 600 mg by mouth 3 (three) times daily.   . meloxicam  (MOBIC) 15 MG tablet Take 15 mg by mouth as needed for pain.   . metoprolol tartrate (LOPRESSOR) 25 MG tablet Take 25 mg by mouth 2 (two) times daily.  . nitroGLYCERIN (NITROSTAT) 0.4 MG SL tablet Place 1 tablet (0.4 mg total) under the tongue every 5 (five) minutes as needed for chest pain.  . rosuvastatin (CRESTOR) 5 MG tablet Take 1 tablet (5 mg total) by mouth daily. (Patient taking differently: Take 5 mg by mouth every evening. )  . sertraline (ZOLOFT) 50 MG tablet Take 50 mg by mouth daily.    Marland Kitchen tiZANidine (ZANAFLEX) 4 MG tablet Take 4 mg by mouth at bedtime.   Marland Kitchen VASCEPA 1 g CAPS Take 1 g by mouth 2 (two) times daily.   . [DISCONTINUED] naproxen sodium (ALEVE) 220 MG tablet Take 220 mg by mouth daily as needed (for pain).     Allergies: Allergies  Allergen Reactions  . Zocor [Simvastatin] Other (See Comments)    myalgias    Social History: The patient  reports that  has never smoked. he has never used smokeless tobacco. He reports that he does not drink alcohol or use drugs.   Family History: The patient's family history includes Edema in his mother.   Review of Systems: Please see the history of present illness.   Otherwise, the review of systems is positive for none.   All other systems are reviewed and negative.   Physical Exam: VS:  BP 118/82 (BP Location: Left Arm, Patient Position: Sitting, Cuff Size: Normal)   Pulse (!) 56   Ht 5\' 8"  (1.727 m)   Wt 188 lb 12.8 oz (85.6 kg)   SpO2 98% Comment: at rest  BMI 28.71 kg/m  .  BMI Body mass index is 28.71 kg/m.  Wt Readings from Last 3 Encounters:  07/06/17 188 lb 12.8 oz (85.6 kg)  06/25/17 190 lb 4.1 oz (86.3 kg)  06/22/17 191 lb (86.6 kg)    General: Pleasant. Well developed, well nourished and in no acute distress.   HEENT: Normal.  Neck: Supple, no JVD, carotid bruits, or masses noted.  Cardiac: Regular rate and rhythm. No murmurs, rubs, or gallops. No edema.  Respiratory:  Lungs are clear to auscultation  bilaterally with normal work of breathing.  GI: Soft and nontender.  MS: No deformity or atrophy. Gait and ROM intact.  Skin: Warm and dry. Color is normal.  Neuro:  Strength and sensation are intact and no gross focal deficits noted.  Psych: Alert, appropriate and with normal affect. No problems with the left wrist.    LABORATORY DATA:  EKG:  EKG is not ordered today.  Lab Results  Component Value Date   WBC 7.4 06/25/2017   HGB 14.3 06/25/2017  HCT 42.7 06/25/2017   PLT 218 06/25/2017   GLUCOSE 108 (H) 06/25/2017   CHOL 131 06/22/2017   TRIG 225 (H) 06/22/2017   HDL 45 06/22/2017   LDLDIRECT 95.1 12/26/2010   LDLCALC 41 06/22/2017   ALT 27 06/22/2017   AST 23 06/22/2017   NA 140 06/25/2017   K 3.6 06/25/2017   CL 107 06/25/2017   CREATININE 0.71 06/25/2017   BUN 9 06/25/2017   CO2 24 06/25/2017   INR 1.1 06/22/2017     BNP (last 3 results) No results for input(s): BNP in the last 8760 hours.  ProBNP (last 3 results) No results for input(s): PROBNP in the last 8760 hours.   Other Studies Reviewed Today:  Procedures   CORONARY STENT INTERVENTION 05/2017  LEFT HEART CATH AND CORS/GRAFTS ANGIOGRAPHY  Conclusion     Prox RCA lesion is 95% stenosed.  Ost LAD to Prox LAD lesion is 100% stenosed.  Ost Ramus to Ramus lesion is 90% stenosed.  A drug-eluting stent was successfully placed using a STENT SYNERGY DES 2.5X20.  Post intervention, there is a 0% residual stenosis.  Prox Cx lesion is 95% stenosed.  Post intervention, there is a 0% residual stenosis.  A drug-eluting stent was successfully placed using a STENT SYNERGY DES 3.5X20.  SVG graft was visualized by angiography and is normal in caliber.  The graft exhibits no disease.  LIMA graft was visualized by angiography and is normal in caliber.  The graft exhibits no disease.  The left ventricular systolic function is normal.  LV end diastolic pressure is normal.  The left ventricular  ejection fraction is 55-65% by visual estimate.   1. Severe 3 vessel obstructive CAD 2. Patent LIMA to the LAD 3. Patent SVG to the first diagonal 4. Normal LV function 5. Normal LVEDP 6. Successful PCI of the mid LCx- dominant vessel with DES 7. Successful PCI of the proximal ramus intermediate with DES  Plan: DAPT for one year. Anticipate DC in am. The nondominant RCA is too small for PCI and should be managed medically.     Myoview Study Highlights 04/2017    Nuclear stress EF: 65%. There is septal wall akinesis (post CABG septal wall changes).  There was no ST segment deviation noted during stress.  Defect 1: There is a large defect of moderate severity present in the basal inferior, mid inferior and apical inferior location.  Findings consistent with prior myocardial infarction with peri-infarct ischemia.  This is an intermediate risk study.  Donato Schultz, MD     Assessment/Plan:  1. CAD - prior CABG back in 2009 - recent intermediate stress Myoview in the setting of chest pain - s/p cath with PCI of the mid LCx- dominant vessel with DES and successful PCI of the proximal ramus intermediate with DES - on DAPT - he is doing well clinically. Chest pain is resolved. Surveillance labs today.   2. Residual RCA disease - too small for intervention - to treat medically - his symptoms are now resolved.   3. HTN - BPis just fine now - no changes made today.   4. HLD - lipids from January noted. LDL was 41 - continue with Crestor  5. Resting bradycardia - asymptomatic. I have left him on his current regimen.  6. Chronic pain syndrome - he is followed in the pain clinic - he has stopped his Mobic due to being on DAPT. Noted that he could have his procedure without interruption of his DAPT.  Current medicines are reviewed with the patient today.  The patient does not have concerns regarding medicines other than what has been noted above.  The following changes  have been made:  See above.  Labs/ tests ordered today include:   No orders of the defined types were placed in this encounter.    Disposition:   FU with me in 3 months.   Patient is agreeable to this plan and will call if any problems develop in the interim.   SignedNorma Fredrickson, NP  07/06/2017 8:35 AM  [facility] [address] [name], [place]  [address] Phone: [phone] Fax: [phone]

## 2017-07-06 NOTE — Telephone Encounter (Signed)
Pt was referred to [facility] per Phase I. Closed referral.

## 2017-07-06 NOTE — Patient Instructions (Addendum)
We will be checking the following labs today - BMET & CBC  Medication Instructions:    Continue with your current medicines.   Stay off the Mobic    Testing/Procedures To Be Arranged:  N/A  Follow-Up:   See me in 3 months    Other Special Instructions:   N/A    If you need a refill on your cardiac medications before your next appointment, please call your pharmacy.   Call the [facility] office at [phone] if you have any questions, problems or concerns.

## 2017-07-13 DIAGNOSIS — M47816 Spondylosis without myelopathy or radiculopathy, lumbar region: Secondary | ICD-10-CM | POA: Diagnosis not present

## 2017-07-27 DIAGNOSIS — M47816 Spondylosis without myelopathy or radiculopathy, lumbar region: Secondary | ICD-10-CM | POA: Diagnosis not present

## 2017-08-07 DIAGNOSIS — I2589 Other forms of chronic ischemic heart disease: Secondary | ICD-10-CM | POA: Diagnosis not present

## 2017-08-07 DIAGNOSIS — Z6830 Body mass index (BMI) 30.0-30.9, adult: Secondary | ICD-10-CM | POA: Diagnosis not present

## 2017-08-07 DIAGNOSIS — E119 Type 2 diabetes mellitus without complications: Secondary | ICD-10-CM | POA: Diagnosis not present

## 2017-08-07 DIAGNOSIS — I480 Paroxysmal atrial fibrillation: Secondary | ICD-10-CM | POA: Diagnosis not present

## 2017-08-07 DIAGNOSIS — Z125 Encounter for screening for malignant neoplasm of prostate: Secondary | ICD-10-CM | POA: Diagnosis not present

## 2017-08-07 DIAGNOSIS — Z0000 Encounter for general adult medical examination without abnormal findings: Secondary | ICD-10-CM | POA: Diagnosis not present

## 2017-08-07 DIAGNOSIS — Z1389 Encounter for screening for other disorder: Secondary | ICD-10-CM | POA: Diagnosis not present

## 2017-08-07 DIAGNOSIS — F5221 Male erectile disorder: Secondary | ICD-10-CM | POA: Diagnosis not present

## 2017-08-07 DIAGNOSIS — R82998 Other abnormal findings in urine: Secondary | ICD-10-CM | POA: Diagnosis not present

## 2017-08-14 DIAGNOSIS — M47816 Spondylosis without myelopathy or radiculopathy, lumbar region: Secondary | ICD-10-CM | POA: Diagnosis not present

## 2017-08-28 DIAGNOSIS — M47816 Spondylosis without myelopathy or radiculopathy, lumbar region: Secondary | ICD-10-CM | POA: Diagnosis not present

## 2017-09-07 ENCOUNTER — Other Ambulatory Visit: Payer: Self-pay | Admitting: Nurse Practitioner

## 2017-09-07 MED ORDER — METOPROLOL TARTRATE 25 MG PO TABS: 25.0000 mg | ORAL_TABLET | Freq: Two times a day (BID) | ORAL | 2 refills | Status: DC

## 2017-09-07 NOTE — Telephone Encounter (Signed)
Pt's medication was sent to pt's pharmacy as requested. Confirmation received.  °

## 2017-09-22 DIAGNOSIS — G894 Chronic pain syndrome: Secondary | ICD-10-CM | POA: Diagnosis not present

## 2017-09-22 DIAGNOSIS — M7918 Myalgia, other site: Secondary | ICD-10-CM | POA: Diagnosis not present

## 2017-09-22 DIAGNOSIS — M47816 Spondylosis without myelopathy or radiculopathy, lumbar region: Secondary | ICD-10-CM | POA: Diagnosis not present

## 2017-10-12 ENCOUNTER — Ambulatory Visit: Payer: BLUE CROSS/BLUE SHIELD | Admitting: Nurse Practitioner

## 2017-10-12 ENCOUNTER — Encounter (INDEPENDENT_AMBULATORY_CARE_PROVIDER_SITE_OTHER): Payer: Self-pay

## 2017-10-12 ENCOUNTER — Encounter: Payer: Self-pay | Admitting: Nurse Practitioner

## 2017-10-12 VITALS — BP 118/62 | HR 58 | Ht 67.0 in | Wt 190.8 lb

## 2017-10-12 DIAGNOSIS — E7849 Other hyperlipidemia: Secondary | ICD-10-CM

## 2017-10-12 DIAGNOSIS — I10 Essential (primary) hypertension: Secondary | ICD-10-CM

## 2017-10-12 DIAGNOSIS — Z955 Presence of coronary angioplasty implant and graft: Secondary | ICD-10-CM

## 2017-10-12 DIAGNOSIS — I259 Chronic ischemic heart disease, unspecified: Secondary | ICD-10-CM

## 2017-10-12 NOTE — Progress Notes (Signed)
CARDIOLOGY OFFICE NOTE  Date:  10/12/2017    Rodena Goldmann Lio Date of Birth: [birth year] Medical Record #[id number]  PCP:  Gaspar Garbe, MD  Cardiologist:  Tyrone Sage & Nahser    Chief Complaint  Patient presents with  . Coronary Artery Disease    3 month check - seen for Dr. Elease Hashimoto    History of Present Illness: Ellsworth Waldschmidt Lineberry is a 62 y.o. male who presents today for a 3 month check. Seen for Dr. Elease Hashimoto. Former patient of Dr. Ronnald Nian.   He has known CAD with prior CABG in 2009 with LIMA to LAD and SVG to DX. Intermediate vessel too small to bypass. His surgery was in [place].Other issues include ED, HTN and HLD.No prior history of MI noted.  SeeninNovember of 2018 - some chest pain reported - somewhat similar to prior chest pain syndrome - referred for Myoview - this was abnormal.He was working out of town and was not able to come back for follow up until January of 2019 - then referred for cath - ended up having 2 vessel PCI. On DAPT. Last seen in February and was doing well with no further chest pain noted.   Comes back today. Herealone. He feels like he is doing well. No chest pain. Not short of breath. Not dizzy or lightheaded. Rare palpitations - but no symptoms. BP looks great. Medicines are ok. Headed to [employer] to work for the next few years at [employer]. Overall, he is happy with how he is doing and has no complaints.   Past Medical History:  Diagnosis Date  . Chronic back pain   . Coronary artery disease    a. CABGx2 Left internal mammary artery to the LAD and a saphenous vein graft to the first diagonal with apparent intermediate coronary that was felt to be too small to bypass. b. Abnl stress test 05/2017 s/p DES to mid LCx, DES to ramus intermediate, nondominant RCA too small for PCI (med rx), normal EF.  Marland Kitchen Depression   . ED (erectile dysfunction)   . Fatigue   . Hyperlipidemia   . Hypertension   . Osteoarthritis    "qwhere" (06/24/2017)  . Sinus  bradycardia     Past Surgical History:  Procedure Laterality Date  . CARDIAC CATHETERIZATION  04/18/2008   severe stenosis in the left anterior descending and what was described at that point as the first and second diagonal vessels.  The right coronary artery was nondominant .  The left circumfles had what was felt to be moderate sesions in its midportions  . CORONARY ANGIOPLASTY WITH STENT PLACEMENT  06/24/2017  . CORONARY ARTERY BYPASS GRAFT  04/19/2008   x2 -- EF of 70% -- left internal mammary artery to the LAD and a saphenous vein graft to the first diagonal with apparent intermediate coronary that was felt to be too small to bypass  . CORONARY STENT INTERVENTION N/A 06/24/2017   Procedure: CORONARY STENT INTERVENTION;  Surgeon: Swaziland, Peter M, MD;  Location: [facility] INVASIVE CV LAB;  Service: Cardiovascular;  Laterality: N/A;  . JOINT REPLACEMENT    . LEFT HEART CATH AND CORS/GRAFTS ANGIOGRAPHY N/A 06/24/2017   Procedure: LEFT HEART CATH AND CORS/GRAFTS ANGIOGRAPHY;  Surgeon: Swaziland, Peter M, MD;  Location: [facility] INVASIVE CV LAB;  Service: Cardiovascular;  Laterality: N/A;  . REVISION TOTAL HIP ARTHROPLASTY Left 01/2014   "@ [address]"  . TOTAL HIP ARTHROPLASTY Left 12/2005     Medications: Current Meds  Medication Sig  .  acetaminophen (TYLENOL) 500 MG tablet Take 1,000 mg by mouth every 6 (six) hours as needed for mild pain (back).  Marland Kitchen aspirin EC 81 MG tablet Take 1 tablet (81 mg total) by mouth daily.  . clopidogrel (PLAVIX) 75 MG tablet Take 1 tablet (75 mg total) by mouth daily.  Marland Kitchen gabapentin (NEURONTIN) 600 MG tablet Take 600 mg by mouth 3 (three) times daily.   . metoprolol tartrate (LOPRESSOR) 25 MG tablet Take 1 tablet (25 mg total) by mouth 2 (two) times daily.  . nitroGLYCERIN (NITROSTAT) 0.4 MG SL tablet Place 1 tablet (0.4 mg total) under the tongue every 5 (five) minutes as needed for chest pain.  . rosuvastatin (CRESTOR) 5 MG tablet Take 1 tablet (5 mg total) by mouth daily.  (Patient taking differently: Take 5 mg by mouth every evening. )  . sertraline (ZOLOFT) 50 MG tablet Take 50 mg by mouth daily.    Marland Kitchen tiZANidine (ZANAFLEX) 4 MG tablet Take 4 mg by mouth at bedtime.   Marland Kitchen VASCEPA 1 g CAPS Take 1 g by mouth 2 (two) times daily.      Allergies: Allergies  Allergen Reactions  . Zocor [Simvastatin] Other (See Comments)    myalgias    Social History: The patient  reports that he has never smoked. He has never used smokeless tobacco. He reports that he does not drink alcohol or use drugs.   Family History: The patient's family history includes Edema in his mother.   Review of Systems: Please see the history of present illness.   Otherwise, the review of systems is positive for none.   All other systems are reviewed and negative.   Physical Exam: VS:  BP 118/62 (BP Location: Left Arm, Patient Position: Sitting, Cuff Size: Normal)   Pulse (!) 58   Ht  (1.702 m)   Wt 190 lb 12.8 oz (86.5 kg)   SpO2 98% Comment: at rest  BMI 29.88 kg/m  .  BMI Body mass index is 29.88 kg/m.  Wt Readings from Last 3 Encounters:  10/12/17 190 lb 12.8 oz (86.5 kg)  07/06/17 188 lb 12.8 oz (85.6 kg)  06/25/17 190 lb 4.1 oz (86.3 kg)    General: Pleasant. Well developed, well nourished and in no acute distress.   HEENT: Normal. Poor dentition.  Neck: Supple, no JVD, carotid bruits, or masses noted.  Cardiac: Regular rate and rhythm. No murmurs, rubs, or gallops. No edema.  Respiratory:  Lungs are clear to auscultation bilaterally with normal work of breathing.  GI: Soft and nontender.  MS: No deformity or atrophy. Gait and ROM intact.  Skin: Warm and dry. Color is normal.  Neuro:  Strength and sensation are intact and no gross focal deficits noted.  Psych: Alert, appropriate and with normal affect.   LABORATORY DATA:  EKG:  EKG is not ordered today.  Lab Results  Component Value Date   WBC 7.4 07/06/2017   HGB 15.1 07/06/2017   HCT 44.0 07/06/2017   PLT  284 07/06/2017   GLUCOSE 112 (H) 07/06/2017   CHOL 131 06/22/2017   TRIG 225 (H) 06/22/2017   HDL 45 06/22/2017   LDLDIRECT 95.1 12/26/2010   LDLCALC 41 06/22/2017   ALT 27 06/22/2017   AST 23 06/22/2017   NA 139 07/06/2017   K 4.3 07/06/2017   CL 102 07/06/2017   CREATININE 0.76 07/06/2017   BUN 12 07/06/2017   CO2 23 07/06/2017   INR 1.1 06/22/2017     BNP (  last 3 results) No results for input(s): BNP in the last 8760 hours.  ProBNP (last 3 results) No results for input(s): PROBNP in the last 8760 hours.   Other Studies Reviewed Today:  CORONARY STENT INTERVENTION 05/2017  LEFT HEART CATH AND CORS/GRAFTS ANGIOGRAPHY  Conclusion     Prox RCA lesion is 95% stenosed.  Ost LAD to Prox LAD lesion is 100% stenosed.  Ost Ramus to Ramus lesion is 90% stenosed.  A drug-eluting stent was successfully placed using a STENT SYNERGY DES 2.5X20.  Post intervention, there is a 0% residual stenosis.  Prox Cx lesion is 95% stenosed.  Post intervention, there is a 0% residual stenosis.  A drug-eluting stent was successfully placed using a STENT SYNERGY DES 3.5X20.  SVG graft was visualized by angiography and is normal in caliber.  The graft exhibits no disease.  LIMA graft was visualized by angiography and is normal in caliber.  The graft exhibits no disease.  The left ventricular systolic function is normal.  LV end diastolic pressure is normal.  The left ventricular ejection fraction is 55-65% by visual estimate.  1. Severe 3 vessel obstructive CAD 2. Patent LIMA to the LAD 3. Patent SVG to the first diagonal 4. Normal LV function 5. Normal LVEDP 6. Successful PCI of the mid LCx- dominant vessel with DES 7. Successful PCI of the proximal ramus intermediate with DES  Plan: DAPT for one year. Anticipate DC in am. The nondominant RCA is too small for PCI and should be managed medically.     MyoviewStudy Highlights12/2018    Nuclear stress EF: 65%.  There is septal wall akinesis (post CABG septal wall changes).  There was no ST segment deviation noted during stress.  Defect 1: There is a large defect of moderate severity present in the basal inferior, mid inferior and apical inferior location.  Findings consistent with prior myocardial infarction with peri-infarct ischemia.  This is an intermediate risk study.  Donato Schultz, MD     Assessment/Plan:  1. CAD - prior CABG back in 2009 -his most recent cath was due to chest pain/abnormal Myoview study - underwent 2 vessel PCI to the mid LCx- dominant vessel with DES and successful PCI of the proximal ramus intermediate with DES. He continues to do very well. No symptoms. Remains on DAPT.   2. Residual RCA disease - too small for intervention - to treat medically - he continues to do very well and has no symptoms.   3. HTN - BPlooks good. No changes made with his current regimen.   4. HLD - lipids from January noted. LDL was 41 - continue with Crestor - plan to recheck lab in 6 months.   5. Resting bradycardia - asymptomatic.  6. Chronic pain syndrome- followed by the pain clinic.   Current medicines are reviewed with the patient today.  The patient does not have concerns regarding medicines other than what has been noted above.  The following changes have been made:  See above.  Labs/ tests ordered today include:   No orders of the defined types were placed in this encounter.    Disposition:   FU with me in 6 months. Will check fasting labs on return.    Patient is agreeable to this plan and will call if any problems develop in the interim.   SignedNorma Fredrickson, NP  10/12/2017 8:18 AM  [facility] [address] [place], [place]  [address] Phone: [phone] Fax: [phone]        

## 2017-10-12 NOTE — Patient Instructions (Signed)
We will be checking the following labs today - NONE   Medication Instructions:    Continue with your current medicines.     Testing/Procedures To Be Arranged:  N/A  Follow-Up:   See me in 6 months with fasting labs    Other Special Instructions:   Keep up the good work!    If you need a refill on your cardiac medications before your next appointment, please call your pharmacy.   Call the [facility] office at [phone] if you have any questions, problems or concerns.      

## 2017-10-26 ENCOUNTER — Ambulatory Visit: Payer: BLUE CROSS/BLUE SHIELD | Admitting: Nurse Practitioner

## 2017-12-01 DIAGNOSIS — Z6829 Body mass index (BMI) 29.0-29.9, adult: Secondary | ICD-10-CM | POA: Diagnosis not present

## 2017-12-01 DIAGNOSIS — R5383 Other fatigue: Secondary | ICD-10-CM | POA: Diagnosis not present

## 2017-12-01 DIAGNOSIS — R05 Cough: Secondary | ICD-10-CM | POA: Diagnosis not present

## 2017-12-06 DIAGNOSIS — R001 Bradycardia, unspecified: Secondary | ICD-10-CM | POA: Diagnosis not present

## 2017-12-06 DIAGNOSIS — R05 Cough: Secondary | ICD-10-CM | POA: Diagnosis not present

## 2017-12-06 DIAGNOSIS — R5383 Other fatigue: Secondary | ICD-10-CM | POA: Diagnosis not present

## 2017-12-06 DIAGNOSIS — J40 Bronchitis, not specified as acute or chronic: Secondary | ICD-10-CM | POA: Diagnosis not present

## 2017-12-07 DIAGNOSIS — I440 Atrioventricular block, first degree: Secondary | ICD-10-CM | POA: Diagnosis not present

## 2017-12-07 DIAGNOSIS — R001 Bradycardia, unspecified: Secondary | ICD-10-CM | POA: Diagnosis not present

## 2018-02-08 DIAGNOSIS — I2510 Atherosclerotic heart disease of native coronary artery without angina pectoris: Secondary | ICD-10-CM | POA: Diagnosis not present

## 2018-02-08 DIAGNOSIS — I2589 Other forms of chronic ischemic heart disease: Secondary | ICD-10-CM | POA: Diagnosis not present

## 2018-02-08 DIAGNOSIS — E781 Pure hyperglyceridemia: Secondary | ICD-10-CM | POA: Diagnosis not present

## 2018-02-08 DIAGNOSIS — E119 Type 2 diabetes mellitus without complications: Secondary | ICD-10-CM | POA: Diagnosis not present

## 2018-02-26 ENCOUNTER — Encounter: Payer: Self-pay | Admitting: Podiatry

## 2018-02-26 ENCOUNTER — Ambulatory Visit: Payer: BLUE CROSS/BLUE SHIELD | Admitting: Podiatry

## 2018-02-26 VITALS — BP 126/75 | HR 49 | Resp 16

## 2018-02-26 DIAGNOSIS — B351 Tinea unguium: Secondary | ICD-10-CM | POA: Diagnosis not present

## 2018-02-26 DIAGNOSIS — L603 Nail dystrophy: Secondary | ICD-10-CM

## 2018-02-26 DIAGNOSIS — D689 Coagulation defect, unspecified: Secondary | ICD-10-CM | POA: Diagnosis not present

## 2018-02-26 DIAGNOSIS — B353 Tinea pedis: Secondary | ICD-10-CM

## 2018-02-26 DIAGNOSIS — M79676 Pain in unspecified toe(s): Secondary | ICD-10-CM | POA: Diagnosis not present

## 2018-02-26 DIAGNOSIS — M79609 Pain in unspecified limb: Principal | ICD-10-CM

## 2018-02-26 MED ORDER — CICLOPIROX 8 % EX SOLN: Freq: Every day | CUTANEOUS | 0 refills | Status: DC

## 2018-02-26 MED ORDER — FLUCONAZOLE 150 MG PO TABS: 150.0000 mg | ORAL_TABLET | ORAL | 1 refills | Status: DC

## 2018-02-26 NOTE — Progress Notes (Signed)
Subjective:  Patient ID: Jared [name], male    DOB: 08-May-1956,  MRN: [id number]  Chief Complaint  Patient presents with  . Nail Problem    Bilateral; great toes & 5th toes; Left foot; 3rd toe; nail discoloration & thickened nails; pt stated, "I have used OTC medications and nothing has helped"; xyrs    62 y.o. male presents with the above complaint. Above history of present illness confirmed with patient. Patient is on plavix for coronary issues.   Review of Systems: Negative except as noted in the HPI. Denies N/V/F/Ch.  Past Medical History:  Diagnosis Date  . Chronic back pain   . Coronary artery disease    a. CABGx2 Left internal mammary artery to the LAD and a saphenous vein graft to the first diagonal with apparent intermediate coronary that was felt to be too small to bypass. b. Abnl stress test 05/2017 s/p DES to mid LCx, DES to ramus intermediate, nondominant RCA too small for PCI (med rx), normal EF.  Marland Kitchen Depression   . ED (erectile dysfunction)   . Fatigue   . Hyperlipidemia   . Hypertension   . Osteoarthritis    "qwhere" (06/24/2017)  . Sinus bradycardia     Current Outpatient Medications:  .  acetaminophen (TYLENOL) 500 MG tablet, Take 1,000 mg by mouth every 6 (six) hours as needed for mild pain (back)., Disp: , Rfl:  .  aspirin EC 81 MG tablet, Take 1 tablet (81 mg total) by mouth daily., Disp: 90 tablet, Rfl: 3 .  clopidogrel (PLAVIX) 75 MG tablet, Take 1 tablet (75 mg total) by mouth daily., Disp: 90 tablet, Rfl: 3 .  gabapentin (NEURONTIN) 600 MG tablet, Take 600 mg by mouth 3 (three) times daily. , Disp: , Rfl:  .  metoprolol tartrate (LOPRESSOR) 25 MG tablet, Take 1 tablet (25 mg total) by mouth 2 (two) times daily., Disp: 180 tablet, Rfl: 2 .  rosuvastatin (CRESTOR) 5 MG tablet, Take 1 tablet (5 mg total) by mouth daily. (Patient taking differently: Take 5 mg by mouth every evening. ), Disp: 90 tablet, Rfl: 3 .  sertraline (ZOLOFT) 50 MG tablet, Take 50 mg by  mouth daily.  , Disp: , Rfl:  .  tiZANidine (ZANAFLEX) 4 MG tablet, Take 4 mg by mouth at bedtime. , Disp: , Rfl: 0 .  VASCEPA 1 g CAPS, Take 1 g by mouth 2 (two) times daily. , Disp: , Rfl:  .  ciclopirox (PENLAC) 8 % solution, Apply topically at bedtime. Apply over nail and surrounding skin. Apply daily over previous coat. Remove weekly with file or polish remover., Disp: 6.6 mL, Rfl: 0 .  fluconazole (DIFLUCAN) 150 MG tablet, Take 1 tablet (150 mg total) by mouth once a week., Disp: 6 tablet, Rfl: 1 .  nitroGLYCERIN (NITROSTAT) 0.4 MG SL tablet, Place 1 tablet (0.4 mg total) under the tongue every 5 (five) minutes as needed for chest pain., Disp: 25 tablet, Rfl: 3  Social History   Tobacco Use  Smoking Status Never Smoker  Smokeless Tobacco Never Used    Allergies  Allergen Reactions  . Zocor [Simvastatin] Other (See Comments)    myalgias   Objective:   Vitals:   02/26/18 0849  BP: 126/75  Pulse: (!) 49  Resp: 16   There is no height or weight on file to calculate BMI. Constitutional Well developed. Well nourished.  Vascular Dorsalis pedis pulses palpable bilaterally. Posterior tibial pulses palpable bilaterally. Capillary refill normal to all digits.  No cyanosis or clubbing noted. Pedal hair growth normal.  Neurologic Normal speech. Oriented to person, place, and time. Epicritic sensation to light touch grossly present bilaterally.  Dermatologic Nails elongated and thickened bilaterally with yellow discoloration. No open wounds. No skin lesions.  Orthopedic: Normal joint ROM without pain or crepitus bilaterally. No visible deformities. No bony tenderness.   Radiographs: None today Assessment:   1. Pain due to onychomycosis of nail   2. Coagulation defect (HCC)   3. Tinea pedis of both feet   4. Nail dystrophy    Plan:  Patient was evaluated and treated and all questions answered.  Onychomycosis -Educated on etiology of nail fungus. -Rx for Fluconazole  weekly. Educated on low risk of side effects. Educated on taking of medication. -Rx ciclopirox topically. Educated on use. -Nails debrided as below. -Will see patient for routine footcare 2/2 coagulation defect/    Procedure: Nail Debridement Rationale: Patient meets criteria for routine foot care due to coag defect Type of Debridement: manual, sharp debridement. Instrumentation: Nail nipper, rotary burr. Number of Nails: 10-  Return in about 5 weeks (around 04/02/2018) for Nail Fungus.

## 2018-03-03 ENCOUNTER — Ambulatory Visit: Payer: BLUE CROSS/BLUE SHIELD | Admitting: Nurse Practitioner

## 2018-03-03 ENCOUNTER — Encounter: Payer: Self-pay | Admitting: Nurse Practitioner

## 2018-03-03 VITALS — BP 132/86 | HR 57 | Ht 67.0 in | Wt 189.8 lb

## 2018-03-03 DIAGNOSIS — Z955 Presence of coronary angioplasty implant and graft: Secondary | ICD-10-CM

## 2018-03-03 DIAGNOSIS — I259 Chronic ischemic heart disease, unspecified: Secondary | ICD-10-CM

## 2018-03-03 DIAGNOSIS — E7849 Other hyperlipidemia: Secondary | ICD-10-CM | POA: Diagnosis not present

## 2018-03-03 DIAGNOSIS — I10 Essential (primary) hypertension: Secondary | ICD-10-CM

## 2018-03-03 MED ORDER — ISOSORBIDE MONONITRATE ER 30 MG PO TB24: 30.0000 mg | ORAL_TABLET | Freq: Every day | ORAL | 6 refills | Status: DC

## 2018-03-03 NOTE — Progress Notes (Signed)
CARDIOLOGY OFFICE NOTE  Date:  03/03/2018    Jared [name] Date of Birth: 13-Aug-1955 Medical Record #[id number]  PCP:  Gaspar Garbe, MD  Cardiologist:  Tyrone Sage [facility]    Chief Complaint  Patient presents with  . Coronary Artery Disease    Follow up visit - seen for Dr. Elease Hashimoto    History of Present Illness: Jared [name] is a 62 y.o. male who presents today for a follow up visit - this is a 5 month check. Seen for Dr. Elease Hashimoto. Former patient of Dr. Ronnald Nian. Primarily follows with me.   He has known CAD with prior CABG in 2009 with LIMA to LAD and SVG to DX. Intermediate vessel too small to bypass. His surgery was in [place].Other issues include ED, HTN and HLD.No prior history of MI noted.  SeeninNovemberof 2018- some chest pain reported - somewhat similar to prior chest pain syndrome - referred for Myoview - this was abnormal.He was working out of town and was not able to come back for follow up untilJanuary of 2019 - then referred for cath - ended up having 2 vessel PCI. On DAPT. Last seen in May by me and he was continuing to do well with no chest pain. He has gone back to working out of town.   Comes back today. Herewith his girlfriend today.  He notes he feels his heart is "up and down". No actual chest pain but he does note a "burning sensation" with this "up and down" sensation. He has had no jaw pain or other radiation that would be similar to his prior chest pain syndrome. Might last 10 to 15 minutes and then goes a way. Has been going on for about a month or so. This makes him nervous. Does not seem to be getting any worse. May happen once or twice a week. May have had twice last week. No NTG used. He has been trying to cut back on soda and caffeine just this week with no changes.   Past Medical History:  Diagnosis Date  . Chronic back pain   . Coronary artery disease    a. CABGx2 Left internal mammary artery to the LAD and a saphenous vein graft to  the first diagonal with apparent intermediate coronary that was felt to be too small to bypass. b. Abnl stress test 05/2017 s/p DES to mid LCx, DES to ramus intermediate, nondominant RCA too small for PCI (med rx), normal EF.  Marland Kitchen Depression   . ED (erectile dysfunction)   . Fatigue   . Hyperlipidemia   . Hypertension   . Osteoarthritis    "qwhere" (06/24/2017)  . Sinus bradycardia     Past Surgical History:  Procedure Laterality Date  . CARDIAC CATHETERIZATION  04/18/2008   severe stenosis in the left anterior descending and what was described at that point as the first and second diagonal vessels.  The right coronary artery was nondominant .  The left circumfles had what was felt to be moderate sesions in its midportions  . CORONARY ANGIOPLASTY WITH STENT PLACEMENT  06/24/2017  . CORONARY ARTERY BYPASS GRAFT  04/19/2008   x2 -- EF of 70% -- left internal mammary artery to the LAD and a saphenous vein graft to the first diagonal with apparent intermediate coronary that was felt to be too small to bypass  . CORONARY STENT INTERVENTION N/A 06/24/2017   Procedure: CORONARY STENT INTERVENTION;  Surgeon: Swaziland, Peter M, MD;  Location: [facility] INVASIVE  CV LAB;  Service: Cardiovascular;  Laterality: N/A;  . JOINT REPLACEMENT    . LEFT HEART CATH AND CORS/GRAFTS ANGIOGRAPHY N/A 06/24/2017   Procedure: LEFT HEART CATH AND CORS/GRAFTS ANGIOGRAPHY;  Surgeon: Swaziland, Peter M, MD;  Location: [facility] INVASIVE CV LAB;  Service: Cardiovascular;  Laterality: N/A;  . REVISION TOTAL HIP ARTHROPLASTY Left 01/2014   "@ [address]"  . TOTAL HIP ARTHROPLASTY Left 12/2005     Medications: Current Meds  Medication Sig  . acetaminophen (TYLENOL) 500 MG tablet Take 1,000 mg by mouth every 6 (six) hours as needed for mild pain (back).  Marland Kitchen aspirin EC 81 MG tablet Take 1 tablet (81 mg total) by mouth daily.  . ciclopirox (PENLAC) 8 % solution Apply topically at bedtime. Apply over nail and surrounding skin. Apply daily over  previous coat. Remove weekly with file or polish remover.  . clopidogrel (PLAVIX) 75 MG tablet Take 1 tablet (75 mg total) by mouth daily.  . fluconazole (DIFLUCAN) 150 MG tablet Take 1 tablet (150 mg total) by mouth once a week.  . gabapentin (NEURONTIN) 600 MG tablet Take 600 mg by mouth 3 (three) times daily.   . metoprolol tartrate (LOPRESSOR) 25 MG tablet Take 1 tablet (25 mg total) by mouth 2 (two) times daily.  . nitroGLYCERIN (NITROSTAT) 0.4 MG SL tablet Place 1 tablet (0.4 mg total) under the tongue every 5 (five) minutes as needed for chest pain.  . rosuvastatin (CRESTOR) 5 MG tablet Take 1 tablet (5 mg total) by mouth daily. (Patient taking differently: Take 5 mg by mouth every evening. )  . sertraline (ZOLOFT) 50 MG tablet Take 50 mg by mouth daily.    Marland Kitchen tiZANidine (ZANAFLEX) 4 MG tablet Take 4 mg by mouth at bedtime.   Marland Kitchen VASCEPA 1 g CAPS Take 1 g by mouth 2 (two) times daily.      Allergies: Allergies  Allergen Reactions  . Zocor [Simvastatin] Other (See Comments)    myalgias    Social History: The patient  reports that he has never smoked. He has never used smokeless tobacco. He reports that he does not drink alcohol or use drugs.   Family History: The patient's family history includes Edema in his mother.   Review of Systems: Please see the history of present illness.   Otherwise, the review of systems is positive for none.   All other systems are reviewed and negative.   Physical Exam: VS:  BP 132/86 (BP Location: Left Arm, Patient Position: Sitting, Cuff Size: Normal)   Pulse (!) 57   Ht 5\' 7"  (1.702 m)   Wt 189 lb 12.8 oz (86.1 kg)   BMI 29.73 kg/m  .  BMI Body mass index is 29.73 kg/m.  Wt Readings from Last 3 Encounters:  03/03/18 189 lb 12.8 oz (86.1 kg)  10/12/17 190 lb 12.8 oz (86.5 kg)  07/06/17 188 lb 12.8 oz (85.6 kg)    General: Pleasant. Well developed, well nourished and in no acute distress.   HEENT: Normal but poor dentition.  Neck: Supple,  no JVD, carotid bruits, or masses noted.  Cardiac: Regular rate and rhythm. No murmurs, rubs, or gallops. No edema.  Respiratory:  Lungs are clear to auscultation bilaterally with normal work of breathing.  GI: Soft and nontender.  MS: No deformity or atrophy. Gait and ROM intact.  Skin: Warm and dry. Color is normal.  Neuro:  Strength and sensation are intact and no gross focal deficits noted.  Psych: Alert, appropriate and  with normal affect.   LABORATORY DATA:  EKG:  EKG is ordered today. This demonstrates sinus brady - borderline 1st degree AV block.   Lab Results  Component Value Date   WBC 7.4 07/06/2017   HGB 15.1 07/06/2017   HCT 44.0 07/06/2017   PLT 284 07/06/2017   GLUCOSE 112 (H) 07/06/2017   CHOL 131 06/22/2017   TRIG 225 (H) 06/22/2017   HDL 45 06/22/2017   LDLDIRECT 95.1 12/26/2010   LDLCALC 41 06/22/2017   ALT 27 06/22/2017   AST 23 06/22/2017   NA 139 07/06/2017   K 4.3 07/06/2017   CL 102 07/06/2017   CREATININE 0.76 07/06/2017   BUN 12 07/06/2017   CO2 23 07/06/2017   INR 1.1 06/22/2017       BNP (last 3 results) No results for input(s): BNP in the last 8760 hours.  ProBNP (last 3 results) No results for input(s): PROBNP in the last 8760 hours.   Other Studies Reviewed Today:  CORONARY STENT INTERVENTION1/2019  LEFT HEART CATH AND CORS/GRAFTS ANGIOGRAPHY  Conclusion     Prox RCA lesion is 95% stenosed.  Ost LAD to Prox LAD lesion is 100% stenosed.  Ost Ramus to Ramus lesion is 90% stenosed.  A drug-eluting stent was successfully placed using a STENT SYNERGY DES 2.5X20.  Post intervention, there is a 0% residual stenosis.  Prox Cx lesion is 95% stenosed.  Post intervention, there is a 0% residual stenosis.  A drug-eluting stent was successfully placed using a STENT SYNERGY DES 3.5X20.  SVG graft was visualized by angiography and is normal in caliber.  The graft exhibits no disease.  LIMA graft was visualized by  angiography and is normal in caliber.  The graft exhibits no disease.  The left ventricular systolic function is normal.  LV end diastolic pressure is normal.  The left ventricular ejection fraction is 55-65% by visual estimate.  1. Severe 3 vessel obstructive CAD 2. Patent LIMA to the LAD 3. Patent SVG to the first diagonal 4. Normal LV function 5. Normal LVEDP 6. Successful PCI of the mid LCx- dominant vessel with DES 7. Successful PCI of the proximal ramus intermediate with DES  Plan: DAPT for one year. Anticipate DC in am. The nondominant RCA is too small for PCI and should be managed medically.    MyoviewStudy Highlights12/2018    Nuclear stress EF: 65%. There is septal wall akinesis (post CABG septal wall changes).  There was no ST segment deviation noted during stress.  Defect 1: There is a large defect of moderate severity present in the basal inferior, mid inferior and apical inferior location.  Findings consistent with prior myocardial infarction with peri-infarct ischemia.  This is an intermediate risk study.  Jared Schultz, MD      Assessment/Plan:  1. CAD - prior CABG back in 2009 -with last cath following abnormal Myoview which led to 2 vessel PCI to the mid LCx- dominant vessel with DES and successful PCI of the proximal ramus intermediate with DES in January of 2019. He now has a sensation of "up and down" that is associated with chest burning. He has known residual disease - he is on beta blocker. This does not really sound like palpitations and does not note actual tachy or brady. I want to add Imdur 30 mg a day. Will see back in a few weeks. Further disposition to follow.   2. Residual RCA disease - too small for intervention - to treat medically -see above.  3. HTN - BP ok. Adding Imdur today.   4. HLD - on statin  5. Resting bradycardia - borderline 1st degree AV block. May end up needing monitor.   6. Chronic pain  syndrome- followed by the pain clinic. Not discussed today.    Current medicines are reviewed with the patient today.  The patient does not have concerns regarding medicines other than what has been noted above.  The following changes have been made:  See above.  Labs/ tests ordered today include:    Orders Placed This Encounter  Procedures  . Basic metabolic panel  . CBC  . Hepatic function panel  . Lipid panel  . EKG 12-Lead     Disposition:   FU with me in a few weeks.   Patient is agreeable to this plan and will call if any problems develop in the interim.   SignedNorma Fredrickson, NP  03/03/2018 12:08 PM  [facility] [address] [place], [place]  [address] Phone: [phone] Fax: [phone]

## 2018-03-03 NOTE — Patient Instructions (Addendum)
We will be checking the following labs today - BMET, CBC, HPF and Lipids  If you have labs (blood work) drawn today and your tests are completely normal, you will receive your results only by: Marland Kitchen MyChart Message (if you have MyChart) OR . A paper copy in the mail If you have any lab test that is abnormal or we need to change your treatment, we will call you to review the results.   Medication Instructions:    Continue with your current medicines.   But I am adding Imdur 30 mg to take once a day - ok to take at night if needed & ok to use Tylenol if has headache - this has been sent to your pharmacy.    If you need a refill on your cardiac medications before your next appointment, please call your pharmacy.     Testing/Procedures To Be Arranged:  N/A  Follow-Up:   See me in about 4 weeks    At [facility], you and your health needs are our priority.  As part of our continuing mission to provide you with exceptional heart care, we have created designated Provider Care Teams.  These Care Teams include your primary Cardiologist (physician) and Advanced Practice Providers (APPs -  Physician Assistants and Nurse Practitioners) who all work together to provide you with the care you need, when you need it.  Special Instructions:  . None  Call the [facility] office at [phone] if you have any questions, problems or concerns.

## 2018-03-04 LAB — BASIC METABOLIC PANEL
BUN/Creatinine Ratio: 15 (ref 10–24)
BUN: 12 mg/dL (ref 8–27)
CO2: 24 mmol/L (ref 20–29)
Calcium: 9.6 mg/dL (ref 8.6–10.2)
Chloride: 103 mmol/L (ref 96–106)
Creatinine, Ser: 0.81 mg/dL (ref 0.76–1.27)
GFR calc Af Amer: 109 mL/min/{1.73_m2} (ref >59)
GFR calc non Af Amer: 94 mL/min/{1.73_m2} (ref >59)
Glucose: 110 mg/dL — ABNORMAL HIGH (ref 65–99)
Potassium: 4.9 mmol/L (ref 3.5–5.2)
Sodium: 144 mmol/L (ref 134–144)

## 2018-03-04 LAB — HEPATIC FUNCTION PANEL
ALT: 14 IU/L (ref 0–44)
AST: 14 IU/L (ref 0–40)
Albumin: 4.5 g/dL (ref 3.6–4.8)
Alkaline Phosphatase: 73 IU/L (ref 39–117)
Bilirubin Total: 0.3 mg/dL (ref 0.0–1.2)
Bilirubin, Direct: 0.09 mg/dL (ref 0.00–0.40)
Total Protein: 7 g/dL (ref 6.0–8.5)

## 2018-03-04 LAB — CBC
Hematocrit: 42.7 % (ref 37.5–51.0)
Hemoglobin: 14.5 g/dL (ref 13.0–17.7)
MCH: 30.7 pg (ref 26.6–33.0)
MCHC: 34 g/dL (ref 31.5–35.7)
MCV: 90 fL (ref 79–97)
Platelets: 260 10*3/uL (ref 150–450)
RBC: 4.73 x10E6/uL (ref 4.14–5.80)
RDW: 12.1 % — ABNORMAL LOW (ref 12.3–15.4)
WBC: 6.9 10*3/uL (ref 3.4–10.8)

## 2018-03-04 LAB — LIPID PANEL
Chol/HDL Ratio: 3.2 ratio (ref 0.0–5.0)
Cholesterol, Total: 162 mg/dL (ref 100–199)
HDL: 51 mg/dL (ref >39)
LDL Calculated: 59 mg/dL (ref 0–99)
Triglycerides: 258 mg/dL — ABNORMAL HIGH (ref 0–149)
VLDL Cholesterol Cal: 52 mg/dL — ABNORMAL HIGH (ref 5–40)

## 2018-03-31 ENCOUNTER — Ambulatory Visit: Payer: BLUE CROSS/BLUE SHIELD | Admitting: Nurse Practitioner

## 2018-04-05 DIAGNOSIS — H17821 Peripheral opacity of cornea, right eye: Secondary | ICD-10-CM | POA: Diagnosis not present

## 2018-04-05 DIAGNOSIS — E119 Type 2 diabetes mellitus without complications: Secondary | ICD-10-CM | POA: Diagnosis not present

## 2018-04-05 DIAGNOSIS — H35433 Paving stone degeneration of retina, bilateral: Secondary | ICD-10-CM | POA: Diagnosis not present

## 2018-04-05 DIAGNOSIS — H2513 Age-related nuclear cataract, bilateral: Secondary | ICD-10-CM | POA: Diagnosis not present

## 2018-04-12 ENCOUNTER — Encounter: Payer: Self-pay | Admitting: Nurse Practitioner

## 2018-04-12 ENCOUNTER — Ambulatory Visit (INDEPENDENT_AMBULATORY_CARE_PROVIDER_SITE_OTHER): Payer: BLUE CROSS/BLUE SHIELD

## 2018-04-12 ENCOUNTER — Ambulatory Visit: Payer: BLUE CROSS/BLUE SHIELD | Admitting: Nurse Practitioner

## 2018-04-12 VITALS — BP 132/86 | HR 59 | Ht 67.0 in | Wt 188.0 lb

## 2018-04-12 DIAGNOSIS — R002 Palpitations: Secondary | ICD-10-CM

## 2018-04-12 DIAGNOSIS — R0789 Other chest pain: Secondary | ICD-10-CM

## 2018-04-12 DIAGNOSIS — I259 Chronic ischemic heart disease, unspecified: Secondary | ICD-10-CM | POA: Diagnosis not present

## 2018-04-12 DIAGNOSIS — I10 Essential (primary) hypertension: Secondary | ICD-10-CM | POA: Diagnosis not present

## 2018-04-12 DIAGNOSIS — E7849 Other hyperlipidemia: Secondary | ICD-10-CM | POA: Diagnosis not present

## 2018-04-12 LAB — TSH: TSH: 1.69 u[IU]/mL (ref 0.450–4.500)

## 2018-04-12 NOTE — Progress Notes (Signed)
CARDIOLOGY OFFICE NOTE  Date:  04/12/2018    Jared [name] Date of Birth: 1955/05/29 Medical Record #[id number]  PCP:  Gaspar Garbe, MD  Cardiologist:  Tyrone Sage & Nahser    Chief Complaint  Patient presents with  . Coronary Artery Disease    One month check - seen for Dr. Elease Hashimoto    History of Present Illness: Jared [name] is a 62 y.o. male who presents today for a one month check. Seen for Dr. Elease Hashimoto. Former patient of Dr. Ronnald Nian. Primarily follows with me.   He has known CAD with prior CABG in 2009 with LIMA to LAD and SVG to DX. Intermediate vessel too small to bypass. His surgery was in [place].Other issues include ED, HTN and HLD.No prior history of MI noted.  SeeninNovemberof 2018- some chest pain reported - somewhat similar to prior chest pain syndrome - referred for Myoview - this was abnormal.He was working out of town and was not able to come back for follow up untilJanuaryof 2019- then referred for cath -ended up having2 vessel PCI.On DAPT.Did well following his PCI but last seen in October - some chest pain and heart "up and down". Sounded somewhat similar to his prior chest pain syndrome. I added Imdur to his regimen.    Comes back today. Herewith his girlfriend today. He says he is doing better - he denies anymore chest burning. Says his spells are now less and lasting less time than as before. He feels like the Imdur did help. He is not using any sl NTG. He says "his heart is up and down" still - does not last as long - does not check his pulse with these spells. He is trying to use less caffeine. Last spell was on Saturday - lasted about 30 minutes - occurred at rest - no associated pain just "up and down" is the only way he can describe. He is not lightheaded or dizzy. No syncope.   Past Medical History:  Diagnosis Date  . Chronic back pain   . Coronary artery disease    a. CABGx2 Left internal mammary artery to the LAD and a saphenous  vein graft to the first diagonal with apparent intermediate coronary that was felt to be too small to bypass. b. Abnl stress test 05/2017 s/p DES to mid LCx, DES to ramus intermediate, nondominant RCA too small for PCI (med rx), normal EF.  Marland Kitchen Depression   . ED (erectile dysfunction)   . Fatigue   . Hyperlipidemia   . Hypertension   . Osteoarthritis    "qwhere" (06/24/2017)  . Sinus bradycardia     Past Surgical History:  Procedure Laterality Date  . CARDIAC CATHETERIZATION  04/18/2008   severe stenosis in the left anterior descending and what was described at that point as the first and second diagonal vessels.  The right coronary artery was nondominant .  The left circumfles had what was felt to be moderate sesions in its midportions  . CORONARY ANGIOPLASTY WITH STENT PLACEMENT  06/24/2017  . CORONARY ARTERY BYPASS GRAFT  04/19/2008   x2 -- EF of 70% -- left internal mammary artery to the LAD and a saphenous vein graft to the first diagonal with apparent intermediate coronary that was felt to be too small to bypass  . CORONARY STENT INTERVENTION N/A 06/24/2017   Procedure: CORONARY STENT INTERVENTION;  Surgeon: Swaziland, Peter M, MD;  Location: [facility] INVASIVE CV LAB;  Service: Cardiovascular;  Laterality: N/A;  .  JOINT REPLACEMENT    . LEFT HEART CATH AND CORS/GRAFTS ANGIOGRAPHY N/A 06/24/2017   Procedure: LEFT HEART CATH AND CORS/GRAFTS ANGIOGRAPHY;  Surgeon: SwazilandJordan, Peter M, MD;  [facility];  Service: Cardiovascular;  Laterality: N/A;  . RE[facility]ION [facility] H[facility]   [facility]  . TOTAL HIP ARTHROPLASTY Left 12/2005     Medications: Current Meds  Medication Sig  . acetaminophen (TYLENOL) 500 MG tablet Take 1,000 mg by mouth every 6 (six) hours as needed for mild pain (back).  Marland Kitchen. aspirin EC 81 MG tablet Take 1 tablet (81 mg total) by mouth daily.  . ciclopirox (PENLAC) 8 % solution Apply topically at bedtime. Apply over nail and surrounding skin. Apply  daily over previous coat. Remove weekly with file or polish remover.  . clopidogrel (PLAVIX) 75 MG tablet Take 1 tablet (75 mg total) by mouth daily.  . fluconazole (DIFLUCAN) 150 MG tablet Take 1 tablet (150 mg total) by mouth once a week.  . gabapentin (NEURONTIN) 600 MG tablet Take 600 mg by mouth 3 (three) times daily.   . isosorbide mononitrate (IMDUR) 30 MG 24 hr tablet Take 1 tablet (30 mg total) by mouth daily.  . metoprolol tartrate (LOPRESSOR) 25 MG tablet Take 1 tablet (25 mg total) by mouth 2 (two) times daily.  . rosuvastatin (CRESTOR) 5 MG tablet Take 1 tablet (5 mg total) by mouth daily. (Patient taking differently: Take 5 mg by mouth every evening. )  . sertraline (ZOLOFT) 50 MG tablet Take 50 mg by mouth daily.    Marland Kitchen. tiZANidine (ZANAFLEX) 4 MG tablet Take 4 mg by mouth at bedtime.   Marland Kitchen. VASCEPA 1 g CAPS Take 1 g by mouth 2 (two) times daily.      Allergies: Allergies  Allergen Reactions  . Zocor [Simvastatin] Other (See Comments)    myalgias    Social History: The patient  reports that he has never smoked. He has never used smokeless tobacco. He reports that he does not drink alcohol or use drugs.   Family History: The patient's family history includes Edema in his mother.   Review of Systems: Please see the history of present illness.   Otherwise, the review of systems is positive for back pain, muscle pain, snoring, fatigue and hearing loss.   All other systems are reviewed and negative.   Physical Exam: VS:  BP 132/86 (BP Location: Left Arm, Patient Position: Sitting, Cuff Size: Normal)   Pulse (!) 59   Ht 5\' 7"  (1.702 m)   Wt 188 lb (85.3 kg)   SpO2 94% Comment: at rest  BMI 29.44 kg/m  .  BMI Body mass index is 29.44 kg/m.  Wt Readings from Last 3 Encounters:  04/12/18 188 lb (85.3 kg)  03/03/18 189 lb 12.8 oz (86.1 kg)  10/12/17 190 lb 12.8 oz (86.5 kg)    General: Pleasant. Well developed, well nourished and in no acute distress.   HEENT: Normal.    Neck: Supple, no JVD, carotid bruits, or masses noted.  Cardiac: Regular rate and rhythm. No murmurs, rubs, or gallops. No edema.  Respiratory:  Lungs are clear to auscultation bilaterally with normal work of breathing.  GI: Soft and nontender.  MS: No deformity or atrophy. Gait and ROM intact.  Skin: Warm and dry. Color is normal.  Neuro:  Strength and sensation are intact and no gross focal deficits noted.  Psych: Alert, appropriate and with normal affect.   LABORATORY DATA:  EKG:  EKG is ordered today. This demonstrates sinus bradycardia - HR is 57 today.   Lab Results  Component Value Date   WBC 6.9 03/03/2018   HGB 14.5 03/03/2018   HCT 42.7 03/03/2018   PLT 260 03/03/2018   GLUCOSE 110 (H) 03/03/2018   CHOL 162 03/03/2018   TRIG 258 (H) 03/03/2018   HDL 51 03/03/2018   LDLDIRECT 95.1 12/26/2010   LDLCALC 59 03/03/2018   ALT 14 03/03/2018   AST 14 03/03/2018   NA 144 03/03/2018   K 4.9 03/03/2018   CL 103 03/03/2018   CREATININE 0.81 03/03/2018   BUN 12 03/03/2018   CO2 24 03/03/2018   INR 1.1 06/22/2017     BNP (last 3 results) No results for input(s): BNP in the last 8760 hours.  ProBNP (last 3 results) No results for input(s): PROBNP in the last 8760 hours.   Other Studies Reviewed Today:  CORONARY STENT INTERVENTION1/2019  LEFT HEART CATH AND CORS/GRAFTS ANGIOGRAPHY  Conclusion     Prox RCA lesion is 95% stenosed.  Ost LAD to Prox LAD lesion is 100% stenosed.  Ost Ramus to Ramus lesion is 90% stenosed.  A drug-eluting stent was successfully placed using a STENT SYNERGY DES 2.5X20.  Post intervention, there is a 0% residual stenosis.  Prox Cx lesion is 95% stenosed.  Post intervention, there is a 0% residual stenosis.  A drug-eluting stent was successfully placed using a STENT SYNERGY DES 3.5X20.  SVG graft was visualized by angiography and is normal in caliber.  The graft exhibits no disease.  LIMA graft was visualized by  angiography and is normal in caliber.  The graft exhibits no disease.  The left ventricular systolic function is normal.  LV end diastolic pressure is normal.  The left ventricular ejection fraction is 55-65% by visual estimate.  1. Severe 3 vessel obstructive CAD 2. Patent LIMA to the LAD 3. Patent SVG to the first diagonal 4. Normal LV function 5. Normal LVEDP 6. Successful PCI of the mid LCx- dominant vessel with DES 7. Successful PCI of the proximal ramus intermediate with DES  Plan: DAPT for one year. Anticipate DC in am. The nondominant RCA is too small for PCI and should be managed medically.    MyoviewStudy Highlights12/2018    Nuclear stress EF: 65%. There is septal wall akinesis (post CABG septal wall changes).  There was no ST segment deviation noted during stress.  Defect 1: There is a large defect of moderate severity present in the basal inferior, mid inferior and apical inferior location.  Findings consistent with prior myocardial infarction with peri-infarct ischemia.  This is an intermediate risk study.  Donato Schultz, MD      Assessment/Plan:  1. CAD - prior CABG back in 2009 -with last cath following abnormal Myoview which led to 2 vessel PCI tothe mid LCx- dominant vessel with DES and successful PCI of the proximal ramus intermediate with DES in January of 2019. He has residual disease. Started on Imdur at last visit for concerns of chest pain - he now denies this - sounds more now like palpitations - hard to discern. Will leave him on Imdur. Checking TSH today. Will arrange for event monitor. Further disposition to follow.   2. Residual RCA disease - too small for intervention - to treat medically -see above.   3. HTN - BP is fine on current regimen.   4. HLD - he remains on statin therapy.   5. 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 Will arrange for event monitor. He is on low dose beta blocker - HR  is 57 - doubt we will be able to titrate up given resting bradycardia. Checking TSH today.   6. Chronic pain syndrome-followed by the pain clinic.Not discussed today.    Current medicines are reviewed with the patient today.  The patient does not have concerns regarding medicines other than what has been noted above.  The following changes have been made:  See above.  Labs/ tests ordered today include:    Orders Placed This Encounter  Procedures  . TSH  . CARDIAC EVENT MONITOR  . EKG 12-Lead     Disposition:   Further disposition to follow.  Will see what the monitor shows and then decide about follow up.   Patient is agreeable to this plan and will call if any problems develop in the interim.   SignedNorma Fredrickson, NP  04/12/2018 8:28 AM  [facility] [address] [place], [place]  [address] Phone: [phone] Fax: [phone]

## 2018-04-12 NOTE — Patient Instructions (Addendum)
We will be checking the following labs today - TSH  If you have labs (blood work) drawn today and your tests are completely normal, you will receive your results only by: Marland Kitchen. MyChart Message (if you have MyChart) OR . A paper copy in the mail If you have any lab test that is abnormal or we need to change your treatment, we will call you to review the results.   Medication Instructions:    Continue with your current medicines.    If you need a refill on your cardiac medications before your next appointment, please call your pharmacy.     Testing/Procedures To Be Arranged:  Event monitor  Follow-Up:   Let's see what the monitor shows and we will then decide about follow up.     At [facility], you and your health needs are our priority.  As part of our continuing mission to provide you with exceptional heart care, we have created designated Provider Care Teams.  These Care Teams include your primary Cardiologist (physician) and Advanced Practice Providers (APPs -  Physician Assistants and Nurse Practitioners) who all work together to provide you with the care you need, when you need it.  Special Instructions:  . None  Call the [facility] office at [phone] if you have any questions, problems or concerns.

## 2018-04-16 ENCOUNTER — Other Ambulatory Visit: Payer: Self-pay | Admitting: Podiatry

## 2018-04-16 ENCOUNTER — Telehealth: Payer: Self-pay

## 2018-04-16 MED ORDER — RIVAROXABAN 20 MG PO TABS: 20.0000 mg | ORAL_TABLET | Freq: Every day | ORAL | 2 refills | Status: DC

## 2018-04-16 NOTE — Telephone Encounter (Signed)
Agree.   Nyaire Denbleyker C. Dylann Gallier, RN, ANP-C [facility] [address] [place], [place]  [address] [phone]  

## 2018-04-16 NOTE — Telephone Encounter (Signed)
Fax received from [employer] stating that the patient had Atrial Flutter 80 bpm on 04/15/18 at 6:53 PM. Patient does not have a Hx of Aflutter and is not anticoagulated. Patient with PCI in January 2019 and is taking both ASA and Plavix. Recent labs showing normal CBC, BMET, and TSH. Patient is taking Lopressor 25 mg BID.   Reviewed with DOD, Dr. Clifton JamesMcAlhany and orders given for patient to stop plavix, continue ASA, start Xarelto 20 mg QD, and schedule follow-up appointment after monitor is complete and labs can be rechecked at that time.    Called and made patient aware of the information and instruction above. Patient verbalized understanding. Instructed patient to avoid NSAIDs and to let us know if he develops any S/Sx of bleeding. Appointment made for patient to see Norma FredricksonLori Gerhardt, NP on 12/24 at 10:30 AM. Rx sent to preferred pharmacy and 30 day free card with samples left up front for patient to pick up.

## 2018-04-30 DIAGNOSIS — M47812 Spondylosis without myelopathy or radiculopathy, cervical region: Secondary | ICD-10-CM | POA: Diagnosis not present

## 2018-04-30 DIAGNOSIS — M4722 Other spondylosis with radiculopathy, cervical region: Secondary | ICD-10-CM | POA: Diagnosis not present

## 2018-04-30 DIAGNOSIS — M5412 Radiculopathy, cervical region: Secondary | ICD-10-CM | POA: Diagnosis not present

## 2018-04-30 DIAGNOSIS — M50323 Other cervical disc degeneration at C6-C7 level: Secondary | ICD-10-CM | POA: Diagnosis not present

## 2018-05-13 DIAGNOSIS — M4722 Other spondylosis with radiculopathy, cervical region: Secondary | ICD-10-CM | POA: Diagnosis not present

## 2018-05-13 DIAGNOSIS — M5011 Cervical disc disorder with radiculopathy,  high cervical region: Secondary | ICD-10-CM | POA: Diagnosis not present

## 2018-05-14 DIAGNOSIS — M47812 Spondylosis without myelopathy or radiculopathy, cervical region: Secondary | ICD-10-CM | POA: Diagnosis not present

## 2018-05-14 DIAGNOSIS — M5412 Radiculopathy, cervical region: Secondary | ICD-10-CM | POA: Diagnosis not present

## 2018-05-17 DIAGNOSIS — H2513 Age-related nuclear cataract, bilateral: Secondary | ICD-10-CM | POA: Diagnosis not present

## 2018-05-17 DIAGNOSIS — H02834 Dermatochalasis of left upper eyelid: Secondary | ICD-10-CM | POA: Diagnosis not present

## 2018-05-17 DIAGNOSIS — H02831 Dermatochalasis of right upper eyelid: Secondary | ICD-10-CM | POA: Diagnosis not present

## 2018-05-17 DIAGNOSIS — M47812 Spondylosis without myelopathy or radiculopathy, cervical region: Secondary | ICD-10-CM | POA: Diagnosis not present

## 2018-05-17 DIAGNOSIS — M5412 Radiculopathy, cervical region: Secondary | ICD-10-CM | POA: Diagnosis not present

## 2018-05-17 DIAGNOSIS — H53001 Unspecified amblyopia, right eye: Secondary | ICD-10-CM | POA: Diagnosis not present

## 2018-05-18 ENCOUNTER — Ambulatory Visit: Payer: BLUE CROSS/BLUE SHIELD | Admitting: Nurse Practitioner

## 2018-05-18 ENCOUNTER — Encounter: Payer: Self-pay | Admitting: Nurse Practitioner

## 2018-05-18 VITALS — BP 122/58 | HR 58 | Ht 67.5 in | Wt 191.8 lb

## 2018-05-18 DIAGNOSIS — I259 Chronic ischemic heart disease, unspecified: Secondary | ICD-10-CM | POA: Diagnosis not present

## 2018-05-18 DIAGNOSIS — R400 Somnolence: Secondary | ICD-10-CM

## 2018-05-18 DIAGNOSIS — E7849 Other hyperlipidemia: Secondary | ICD-10-CM

## 2018-05-18 DIAGNOSIS — I10 Essential (primary) hypertension: Secondary | ICD-10-CM

## 2018-05-18 DIAGNOSIS — Z79899 Other long term (current) drug therapy: Secondary | ICD-10-CM

## 2018-05-18 DIAGNOSIS — Z955 Presence of coronary angioplasty implant and graft: Secondary | ICD-10-CM

## 2018-05-18 DIAGNOSIS — I480 Paroxysmal atrial fibrillation: Secondary | ICD-10-CM

## 2018-05-18 DIAGNOSIS — R0683 Snoring: Secondary | ICD-10-CM

## 2018-05-18 LAB — CBC
Hematocrit: 40.4 % (ref 37.5–51.0)
Hemoglobin: 13.8 g/dL (ref 13.0–17.7)
MCH: 30.7 pg (ref 26.6–33.0)
MCHC: 34.2 g/dL (ref 31.5–35.7)
MCV: 90 fL (ref 79–97)
Platelets: 273 10*3/uL (ref 150–450)
RBC: 4.49 x10E6/uL (ref 4.14–5.80)
RDW: 12 % — ABNORMAL LOW (ref 12.3–15.4)
WBC: 6.6 10*3/uL (ref 3.4–10.8)

## 2018-05-18 LAB — BASIC METABOLIC PANEL
BUN/Creatinine Ratio: 20 (ref 10–24)
BUN: 16 mg/dL (ref 8–27)
CO2: 23 mmol/L (ref 20–29)
Calcium: 9 mg/dL (ref 8.6–10.2)
Chloride: 101 mmol/L (ref 96–106)
Creatinine, Ser: 0.79 mg/dL (ref 0.76–1.27)
GFR calc Af Amer: 110 mL/min/{1.73_m2} (ref >59)
GFR calc non Af Amer: 95 mL/min/{1.73_m2} (ref >59)
Glucose: 150 mg/dL — ABNORMAL HIGH (ref 65–99)
Potassium: 3.9 mmol/L (ref 3.5–5.2)
Sodium: 137 mmol/L (ref 134–144)

## 2018-05-18 MED ORDER — NITROGLYCERIN 0.4 MG SL SUBL: 0.4000 mg | SUBLINGUAL_TABLET | SUBLINGUAL | 3 refills | Status: DC | PRN

## 2018-05-18 NOTE — Progress Notes (Signed)
CARDIOLOGY OFFICE NOTE  Date:  05/18/2018    Jared [name] Date of Birth: 01/22/1956 Medical Record #[id number]  PCP:  Gaspar Garbeisovec, Jared W, MD  Cardiologist:  Jared [name]    Chief Complaint  Patient presents with  . Atrial Fibrillation  . Atrial Flutter    Follow up after monitor - seen for Dr. Elease [name]    History of Present Illness: Jared GoldmannDock R [name] is a 62 y.o. male who presents today for a follow up visit. Seen for Dr. Elease [name]. Former patient of Dr. Ronnald [name]'s.Primarily follows with me.  He has known CAD with prior CABG in 2009 with LIMA to LAD and SVG to DX. Intermediate vessel too small to bypass. His surgery was in [place].Other issues include ED, HTN and HLD.No prior history of MI noted.  SeeninNovemberof 2018- some chest pain reported - somewhat similar to prior chest pain syndrome - referred for Myoview - this was abnormal.He was working out of town and was not able to come back for follow up untilJanuaryof 2019- then referred for cath -ended up having2 vessel PCI.On DAPT.Did well following his PCI but last seen in October - some chest pain and heart "up and down". Sounded somewhat similar to his prior chest pain syndrome. I added Imdur to his regimen.   I saw back in October and November - he was doing ok - had had some chest pain - started on Imdur - this helped. Was also endorsing palpitations - hard for him to describe - ended up placing an event monitor - this has showed AF/flutter - paroxysmal. His Plavix was stopped and he was placed on Xarelto.   Comes back today. Herewith hisdaughter today. He is doing ok. Still with some occasional palpitations but nothing that is too bothersome for him. He is tolerating the Xarelto . He remains on his aspirin due to CAD and prior stent. Off Plavix now. He does endorse snoring, does not feel rested when he awakes and daytime somnolence. Now with PAF - needs sleep study. No chest pain. BP is ok. Needs NTG  refilled - not using.   Past Medical History:  Diagnosis Date  . Chronic back pain   . Coronary artery disease    a. CABGx2 Left internal mammary artery to the LAD and a saphenous vein graft to the first diagonal with apparent intermediate coronary that was felt to be too small to bypass. b. Abnl stress test 05/2017 s/p DES to mid LCx, DES to ramus intermediate, nondominant RCA too small for PCI (med rx), normal EF.  Marland Kitchen. Depression   . ED (erectile dysfunction)   . Fatigue   . Hyperlipidemia   . Hypertension   . Osteoarthritis    "qwhere" (06/24/2017)  . Sinus bradycardia     Past Surgical History:  Procedure Laterality Date  . CARDIAC CATHETERIZATION  04/18/2008   severe stenosis in the left anterior descending and what was described at that point as the first and second diagonal vessels.  The right coronary artery was nondominant .  The left circumfles had what was felt to be moderate sesions in its midportions  . CORONARY ANGIOPLASTY WITH STENT PLACEMENT  06/24/2017  . CORONARY ARTERY BYPASS GRAFT  04/19/2008   x2 -- EF of 70% -- left internal mammary artery to the LAD and a saphenous vein graft to the first diagonal with apparent intermediate coronary that was felt to be too small to bypass  . CORONARY STENT INTERVENTION N/A 06/24/2017  Procedure: CORONARY STENT INTERVENTION;  Surgeon: [name], Jared M, MD;  Location: [facility] INVASIVE CV LAB;  Service: Cardiovascular;  Laterality: N/A;  . JOINT REPLACEMENT    . LEFT HEART CATH AND CORS/GRAFTS ANGIOGRAPHY N/A 06/24/2017   Procedure: LEFT HEART CATH AND CORS/GRAFTS ANGIOGRAPHY;  Surgeon: [name], Jared M, MD;  Location: [facility] INVASIVE CV LAB;  Service: Cardiovascular;  Laterality: N/A;  . REVISION TOTAL HIP ARTHROPLASTY Left 01/2014   "@ [address]"  . TOTAL HIP ARTHROPLASTY Left 12/2005     Medications: Current Meds  Medication Sig  . acetaminophen (TYLENOL) 500 MG tablet Take 1,000 mg by mouth every 6 (six) hours as needed for mild pain  (back).  Marland Kitchen aspirin EC 81 MG tablet Take 1 tablet (81 mg total) by mouth daily.  . ciclopirox (PENLAC) 8 % solution SEE NOTES  . fluconazole (DIFLUCAN) 150 MG tablet Take 1 tablet (150 mg total) by mouth once a week.  . gabapentin (NEURONTIN) 600 MG tablet Take 600 mg by mouth 3 (three) times daily.   . isosorbide mononitrate (IMDUR) 30 MG 24 hr tablet Take 1 tablet (30 mg total) by mouth daily.  . metoprolol tartrate (LOPRESSOR) 25 MG tablet Take 1 tablet (25 mg total) by mouth 2 (two) times daily.  . rivaroxaban (XARELTO) 20 MG TABS tablet Take 1 tablet (20 mg total) by mouth daily with supper.  . rosuvastatin (CRESTOR) 5 MG tablet Take 1 tablet (5 mg total) by mouth daily. (Patient taking differently: Take 5 mg by mouth every evening. )  . sertraline (ZOLOFT) 50 MG tablet Take 50 mg by mouth daily.    Marland Kitchen tiZANidine (ZANAFLEX) 4 MG tablet Take 4 mg by mouth at bedtime.   Marland Kitchen VASCEPA 1 g CAPS Take 1 g by mouth 2 (two) times daily.      Allergies: Allergies  Allergen Reactions  . Zocor [Simvastatin] Other (See Comments)    myalgias    Social History: The patient  reports that he has never smoked. He has never used smokeless tobacco. He reports that he does not drink alcohol or use drugs.   Family History: The patient's family history includes Edema in his mother.   Review of Systems: Please see the history of present illness.   Otherwise, the review of systems is positive for none.   All other systems are reviewed and negative.   Physical Exam: VS:  BP (!) 122/58   Pulse (!) 58   Ht 5' 7.5" (1.715 m)   Wt 191 lb 12.8 oz (87 kg)   BMI 29.60 kg/m  .  BMI Body mass index is 29.6 kg/m.  Wt Readings from Last 3 Encounters:  05/18/18 191 lb 12.8 oz (87 kg)  04/12/18 188 lb (85.3 kg)  03/03/18 189 lb 12.8 oz (86.1 kg)    General: Pleasant. Well developed, well nourished and in no acute distress.   HEENT: Normal.  Neck: Supple, no JVD, carotid bruits, or masses noted.  Cardiac:  Regular rate and rhythm. No murmurs, rubs, or gallops. No edema.  Respiratory:  Lungs are clear to auscultation bilaterally with normal work of breathing.  GI: Soft and nontender.  MS: No deformity or atrophy. Gait and ROM intact.  Skin: Warm and dry. Color is normal.  Neuro:  Strength and sensation are intact and no gross focal deficits noted.  Psych: Alert, appropriate and with normal affect.   LABORATORY DATA:  EKG:  EKG is not ordered today.  Lab Results  Component Value Date  WBC 6.9 03/03/2018   HGB 14.5 03/03/2018   HCT 42.7 03/03/2018   PLT 260 03/03/2018   GLUCOSE 110 (H) 03/03/2018   CHOL 162 03/03/2018   TRIG 258 (H) 03/03/2018   HDL 51 03/03/2018   LDLDIRECT 95.1 12/26/2010   LDLCALC 59 03/03/2018   ALT 14 03/03/2018   AST 14 03/03/2018   NA 144 03/03/2018   K 4.9 03/03/2018   CL 103 03/03/2018   CREATININE 0.81 03/03/2018   BUN 12 03/03/2018   CO2 24 03/03/2018   TSH 1.690 04/12/2018   INR 1.1 06/22/2017     BNP (last 3 results) No results for input(s): BNP in the last 8760 hours.  ProBNP (last 3 results) No results for input(s): PROBNP in the last 8760 hours.   Other Studies Reviewed Today: Result Notes for CARDIAC EVENT MONITOR   Notes recorded by [name], Deloris PingPhilip J, MD on 05/17/2018 at 5:20 PM EST Sinus rhythm with occasional episodes of atrial flutter and atrial fib  The atrial fib is known He is seeing Lawson FiscalLori tomorrow    CORONARY STENT INTERVENTION1/2019  LEFT HEART CATH AND CORS/GRAFTS ANGIOGRAPHY  Conclusion     Prox RCA lesion is 95% stenosed.  Ost LAD to Prox LAD lesion is 100% stenosed.  Ost Ramus to Ramus lesion is 90% stenosed.  A drug-eluting stent was successfully placed using a STENT SYNERGY DES 2.5X20.  Post intervention, there is a 0% residual stenosis.  Prox Cx lesion is 95% stenosed.  Post intervention, there is a 0% residual stenosis.  A drug-eluting stent was successfully placed using a STENT SYNERGY DES  3.5X20.  SVG graft was visualized by angiography and is normal in caliber.  The graft exhibits no disease.  LIMA graft was visualized by angiography and is normal in caliber.  The graft exhibits no disease.  The left ventricular systolic function is normal.  LV end diastolic pressure is normal.  The left ventricular ejection fraction is 55-65% by visual estimate.  1. Severe 3 vessel obstructive CAD 2. Patent LIMA to the LAD 3. Patent SVG to the first diagonal 4. Normal LV function 5. Normal LVEDP 6. Successful PCI of the mid LCx- dominant vessel with DES 7. Successful PCI of the proximal ramus intermediate with DES  Plan: DAPT for one year. Anticipate DC in am. The nondominant RCA is too small for PCI and should be managed medically.    MyoviewStudy Highlights12/2018    Nuclear stress EF: 65%. There is septal wall akinesis (post CABG septal wall changes).  There was no ST segment deviation noted during stress.  Defect 1: There is a large defect of moderate severity present in the basal inferior, mid inferior and apical inferior location.  Findings consistent with prior myocardial infarction with peri-infarct ischemia.  This is an intermediate risk study.  Donato SchultzMark Skains, MD      Assessment/Plan:  1. PAF/atrial flutter - now on Xarelto - recheck lab today - CHADSVASC of 2 - will be 1 with his next birthday. He is also on beta blocker. Will continue with current plan.   2. CAD - prior CABG back in 2009 -with last cath following abnormal Myoview which led to2 vessel PCI tothe mid LCx- dominant vessel with DES and successful PCI of the proximal ramus intermediate with DESin January of 2019.He has residual disease. He remains on Imdur. No current chest pain. Plavix was stopped when Xarelto was started due to monitor report.   3. Residual RCA disease - too small for  intervention - to treat medically -see above.  4. HTN -BP looks good - no changes  made today  5. HLD - he remains on statin therapy.   5.  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 Now noted to have PAF/flutter - will follow. Do not feel EP referral needed at this time.   7. Chronic pain syndrome-followed by the pain clinic.Lots of back pain. Handicap paperwork filled out.   8. Probable OSA - now with PAF/flutter, snoring, daytime somnolence - sleep study to be arranged.   Current medicines are reviewed with the patient today.  The patient does not have concerns regarding medicines other than what has been noted above.  The following changes have been made:  See above.  Labs/ tests ordered today include:    Orders Placed This Encounter  Procedures  . Basic metabolic panel  . CBC     Disposition:   FU with me in 3 months.   Patient is agreeable to this plan and will call if any problems develop in the interim.   SignedNorma Fredrickson, NP  05/18/2018 10:44 AM  [facility] [address] [place], [place]  [address] Phone: [phone] Fax: [phone]

## 2018-05-18 NOTE — Patient Instructions (Addendum)
We will be checking the following labs today - BMET & CBC   Medication Instructions:    Continue with your current medicines.   I refilled the NTG today   If you need a refill on your cardiac medications before your next appointment, please call your pharmacy.     Testing/Procedures To Be Arranged:  Sleep study  Follow-Up:   See me in about 3 months    At [facility], you and your health needs are our priority.  As part of our continuing mission to provide you with exceptional heart care, we have created designated Provider Care Teams.  These Care Teams include your primary Cardiologist (physician) and Advanced Practice Providers (APPs -  Physician Assistants and Nurse Practitioners) who all work together to provide you with the care you need, when you need it.  Special Instructions:  . None  Call the [facility] office at [phone] if you have any questions, problems or concerns.

## 2018-05-20 DIAGNOSIS — M47812 Spondylosis without myelopathy or radiculopathy, cervical region: Secondary | ICD-10-CM | POA: Diagnosis not present

## 2018-05-20 DIAGNOSIS — M5412 Radiculopathy, cervical region: Secondary | ICD-10-CM | POA: Diagnosis not present

## 2018-05-27 DIAGNOSIS — M5412 Radiculopathy, cervical region: Secondary | ICD-10-CM | POA: Diagnosis not present

## 2018-05-27 DIAGNOSIS — M47812 Spondylosis without myelopathy or radiculopathy, cervical region: Secondary | ICD-10-CM | POA: Diagnosis not present

## 2018-05-27 DIAGNOSIS — M47892 Other spondylosis, cervical region: Secondary | ICD-10-CM | POA: Diagnosis not present

## 2018-05-28 ENCOUNTER — Other Ambulatory Visit: Payer: Self-pay | Admitting: Podiatry

## 2018-05-31 ENCOUNTER — Telehealth: Payer: Self-pay | Admitting: *Deleted

## 2018-05-31 DIAGNOSIS — M47812 Spondylosis without myelopathy or radiculopathy, cervical region: Secondary | ICD-10-CM | POA: Diagnosis not present

## 2018-05-31 DIAGNOSIS — M47892 Other spondylosis, cervical region: Secondary | ICD-10-CM | POA: Diagnosis not present

## 2018-05-31 DIAGNOSIS — M5412 Radiculopathy, cervical region: Secondary | ICD-10-CM | POA: Diagnosis not present

## 2018-05-31 NOTE — Telephone Encounter (Signed)
-----   Message from Henrietta Dine, RN sent at 05/18/2018 12:48 PM EST ----- Regarding: sleep study Sleep study ordered for precert/scheduling  Thanks!

## 2018-05-31 NOTE — Telephone Encounter (Signed)
Per Danielle T on 05/31/18 @ 11:41 am no PA is required for HST.

## 2018-05-31 NOTE — Telephone Encounter (Signed)
Staff message sent to [facility] denied in lb sleep study. Recommends HST.

## 2018-05-31 NOTE — Telephone Encounter (Signed)
-----   Message from Kathryn A Kemp, RN sent at 05/18/2018 12:48 PM EST ----- Regarding: sleep study Sleep study ordered for precert/scheduling  Thanks!  

## 2018-06-01 ENCOUNTER — Telehealth: Payer: Self-pay | Admitting: *Deleted

## 2018-06-01 DIAGNOSIS — R400 Somnolence: Secondary | ICD-10-CM

## 2018-06-01 NOTE — Telephone Encounter (Signed)
-----   Message from Gaynelle CageWanda M Waddell, CMA sent at 05/31/2018 11:24 AM EST ----- Regarding: RE: sleep study [employer] denied in lab sleep study. Recommends HST ----- Message ----- From: Henrietta DineKemp, Kathryn A, RN Sent: 05/18/2018  12:48 PM EST To: Debbe Balesanielle R Gonzalez, [facility] Sleep Studies Subject: sleep study                                    Sleep study ordered for precert/scheduling  Thanks!

## 2018-06-03 ENCOUNTER — Other Ambulatory Visit: Payer: Self-pay | Admitting: Nurse Practitioner

## 2018-06-04 ENCOUNTER — Ambulatory Visit: Payer: BLUE CROSS/BLUE SHIELD | Admitting: Podiatry

## 2018-06-04 DIAGNOSIS — B351 Tinea unguium: Secondary | ICD-10-CM

## 2018-06-04 DIAGNOSIS — D689 Coagulation defect, unspecified: Secondary | ICD-10-CM | POA: Diagnosis not present

## 2018-06-04 DIAGNOSIS — Z79899 Other long term (current) drug therapy: Secondary | ICD-10-CM

## 2018-06-04 MED ORDER — CICLOPIROX 8 % EX SOLN: Freq: Every day | CUTANEOUS | 0 refills | Status: DC

## 2018-06-04 MED ORDER — FLUCONAZOLE 150 MG PO TABS: 150.0000 mg | ORAL_TABLET | ORAL | 1 refills | Status: DC

## 2018-06-04 NOTE — Progress Notes (Signed)
Subjective:  Patient ID: Jared [name], male    DOB: 11-25-1955,  MRN: [id number]  Chief Complaint  Patient presents with  . Nail Problem    22mo nail care    63 y.o. male presents with the above complaint.  Reports painfully elongated nails to both feet. Using fluconazole and states that it helps.  Review of Systems: Negative except as noted in the HPI. Denies N/V/F/Ch.  Past Medical History:  Diagnosis Date  . Chronic back pain   . Coronary artery disease    a. CABGx2 Left internal mammary artery to the LAD and a saphenous vein graft to the first diagonal with apparent intermediate coronary that was felt to be too small to bypass. b. Abnl stress test 05/2017 s/p DES to mid LCx, DES to ramus intermediate, nondominant RCA too small for PCI (med rx), normal EF.  Marland Kitchen Depression   . ED (erectile dysfunction)   . Fatigue   . Hyperlipidemia   . Hypertension   . Osteoarthritis    "qwhere" (06/24/2017)  . Sinus bradycardia     Current Outpatient Medications:  .  acetaminophen (TYLENOL) 500 MG tablet, Take 1,000 mg by mouth every 6 (six) hours as needed for mild pain (back)., Disp: , Rfl:  .  aspirin EC 81 MG tablet, Take 1 tablet (81 mg total) by mouth daily., Disp: 90 tablet, Rfl: 3 .  fluconazole (DIFLUCAN) 150 MG tablet, Take 1 tablet (150 mg total) by mouth once a week., Disp: 6 tablet, Rfl: 1 .  gabapentin (NEURONTIN) 600 MG tablet, Take 600 mg by mouth 3 (three) times daily. , Disp: , Rfl:  .  lidocaine (LIDODERM) 5 %, Apply patch to painful area. Patch may remain in place for up to 12 hours in a 24 hour period., Disp: , Rfl:  .  methocarbamol (ROBAXIN) 750 MG tablet, TAKE 1 TABLET(750 MG) BY MOUTH THREE TIMES DAILY AS NEEDED, Disp: , Rfl:  .  metoprolol tartrate (LOPRESSOR) 25 MG tablet, TAKE 1 TABLET(25 MG) BY MOUTH TWICE DAILY, Disp: 180 tablet, Rfl: 2 .  nitroGLYCERIN (NITROSTAT) 0.4 MG SL tablet, Place 1 tablet (0.4 mg total) under the tongue every 5 (five) minutes as needed for  chest pain., Disp: 25 tablet, Rfl: 3 .  rivaroxaban (XARELTO) 20 MG TABS tablet, Take 1 tablet (20 mg total) by mouth daily with supper., Disp: 30 tablet, Rfl: 2 .  rosuvastatin (CRESTOR) 5 MG tablet, Take 1 tablet (5 mg total) by mouth daily. (Patient taking differently: Take 5 mg by mouth every evening. ), Disp: 90 tablet, Rfl: 3 .  sertraline (ZOLOFT) 50 MG tablet, Take 50 mg by mouth daily.  , Disp: , Rfl:  .  tiZANidine (ZANAFLEX) 4 MG tablet, Take 4 mg by mouth at bedtime. , Disp: , Rfl: 0 .  VASCEPA 1 g CAPS, Take 1 g by mouth 2 (two) times daily. , Disp: , Rfl:  .  ciclopirox (PENLAC) 8 % solution, Apply topically at bedtime. Apply over nail and surrounding skin. Apply daily over previous coat. Remove weekly with file or polish remover., Disp: 6.6 mL, Rfl: 0 .  isosorbide mononitrate (IMDUR) 30 MG 24 hr tablet, Take 1 tablet (30 mg total) by mouth daily., Disp: 30 tablet, Rfl: 6  Social History   Tobacco Use  Smoking Status Never Smoker  Smokeless Tobacco Never Used    Allergies  Allergen Reactions  . Zocor [Simvastatin] Other (See Comments)    myalgias   Objective:  There were no  vitals filed for this visit. There is no height or weight on file to calculate BMI. Constitutional Well developed. Well nourished.  Vascular Dorsalis pedis pulses palpable bilaterally. Posterior tibial pulses palpable bilaterally. Capillary refill normal to all digits.  No cyanosis or clubbing noted. Pedal hair growth normal.  Neurologic Normal speech. Oriented to person, place, and time. Epicritic sensation to light touch grossly present bilaterally.  Dermatologic Nails elongated dystrophic pain to palpation No open wounds. No skin lesions.  Orthopedic: Normal joint ROM without pain or crepitus bilaterally. No visible deformities. No bony tenderness.   Radiographs: None Assessment:   1. Onychomycosis   2. Coagulation defect (HCC)   3. Encounter for long-term (current) use of high-risk  medication    Plan:  Patient was evaluated and treated and all questions answered.  Onychomycosis with coagulation defect -Nails palliatively debridement as below -Educated on self-care  Procedure: Nail Debridement Rationale: Pain Type of Debridement: manual, sharp debridement. Instrumentation: Nail nipper, rotary burr. Number of Nails: 10  Onychomycosis -Refill fluconazole and penlac. No monitoring of fluconazole indicated at this time.   Return in about 3 months (around 09/03/2018) for Nail Fungus.

## 2018-06-07 DIAGNOSIS — M5412 Radiculopathy, cervical region: Secondary | ICD-10-CM | POA: Diagnosis not present

## 2018-06-07 DIAGNOSIS — M47892 Other spondylosis, cervical region: Secondary | ICD-10-CM | POA: Diagnosis not present

## 2018-06-07 DIAGNOSIS — M47812 Spondylosis without myelopathy or radiculopathy, cervical region: Secondary | ICD-10-CM | POA: Diagnosis not present

## 2018-06-07 NOTE — Telephone Encounter (Signed)
Patient is aware and agreeable to Home Sleep Study through [facility]. Patient is scheduled for 06/09/18 at 10 am to pick up home sleep kit and meet with Respiratory therapist at [facility]. Patient is aware that if this appointment date and time does not work for them they should contact Artis Delay directly at [phone]. Patient is aware that a sleep packet will be sent from [facility] in week. Patient is agreeable to treatment and thankful for call.

## 2018-06-09 ENCOUNTER — Encounter (HOSPITAL_BASED_OUTPATIENT_CLINIC_OR_DEPARTMENT_OTHER): Payer: BLUE CROSS/BLUE SHIELD

## 2018-06-10 DIAGNOSIS — M47812 Spondylosis without myelopathy or radiculopathy, cervical region: Secondary | ICD-10-CM | POA: Diagnosis not present

## 2018-06-10 DIAGNOSIS — M47892 Other spondylosis, cervical region: Secondary | ICD-10-CM | POA: Diagnosis not present

## 2018-06-10 DIAGNOSIS — M5412 Radiculopathy, cervical region: Secondary | ICD-10-CM | POA: Diagnosis not present

## 2018-06-11 DIAGNOSIS — M5412 Radiculopathy, cervical region: Secondary | ICD-10-CM | POA: Diagnosis not present

## 2018-06-11 DIAGNOSIS — M47812 Spondylosis without myelopathy or radiculopathy, cervical region: Secondary | ICD-10-CM | POA: Insufficient documentation

## 2018-07-01 ENCOUNTER — Telehealth: Payer: Self-pay | Admitting: Nurse Practitioner

## 2018-07-01 NOTE — Telephone Encounter (Signed)
Pt takes Xarelto for afib with CHADS2VASc score of 2 (CAD, HTN). Renal function is normal. Recommend holding Xarelto for 3 days prior to spinal injection per protocol.

## 2018-07-01 NOTE — Telephone Encounter (Signed)
New Message    Patient needs to talk to someone today.

## 2018-07-01 NOTE — Telephone Encounter (Signed)
Pharm please address 

## 2018-07-01 NOTE — Telephone Encounter (Signed)
° °  [facility] Pre-operative Risk Assessment    Request for surgical clearance:  1. What type of surgery is being performed? Cervical facet injection  2. When is this surgery scheduled? 07/05/18  3. What type of clearance is required (medical clearance vs. Pharmacy clearance to hold med vs. Both)? Pharmancy  4. Are there any medications that need to be held prior to surgery and how long? [name]  5. Practice name and name of physician performing surgery? [facility] /  Dr. Kathi Der   6. What is your office phone number [phone] option #3   7.   What is your office fax number [phone]  8.   Anesthesia type (None, local, MAC, general) ? none   Jared [name] 07/01/2018, 8:54 AM  _________________________________________________________________   (provider comments below)

## 2018-07-01 NOTE — Telephone Encounter (Signed)
Tried to reach out to requesting surgeon's office to clarify if they have received surgical clearance on said pt. Spoke with Alethea and she advised me that the office closed at 4:30, she was at the answering service, so she paged Dr. Greggory Stallion and was on hold for 20 + minutes waiting for someone to come to the phone and she came back stating she didn't know what else to do at this point.  I advised her that we will try again later to confirm if clearance was received or not.

## 2018-07-02 NOTE — Telephone Encounter (Signed)
Called the office no answer left voicemail and will fax over clearance to the office.

## 2018-07-05 DIAGNOSIS — M47812 Spondylosis without myelopathy or radiculopathy, cervical region: Secondary | ICD-10-CM | POA: Diagnosis not present

## 2018-07-06 ENCOUNTER — Ambulatory Visit (HOSPITAL_BASED_OUTPATIENT_CLINIC_OR_DEPARTMENT_OTHER): Payer: BLUE CROSS/BLUE SHIELD | Attending: Nurse Practitioner

## 2018-07-06 DIAGNOSIS — M47812 Spondylosis without myelopathy or radiculopathy, cervical region: Secondary | ICD-10-CM | POA: Insufficient documentation

## 2018-07-16 ENCOUNTER — Other Ambulatory Visit: Payer: Self-pay | Admitting: Cardiovascular Disease

## 2018-07-19 NOTE — Telephone Encounter (Signed)
Xarelto 20mg  refill request received; pt is 63 yrs old, wt-87kg, Crea-0.79 on 05/18/2018, last seen by Lawson Fiscal in 05/18/2018, CrCl-116.26ml/min; will send in refill to requested pharmacy.

## 2018-07-26 DIAGNOSIS — M47812 Spondylosis without myelopathy or radiculopathy, cervical region: Secondary | ICD-10-CM | POA: Diagnosis not present

## 2018-08-04 ENCOUNTER — Telehealth: Payer: Self-pay | Admitting: Nurse Practitioner

## 2018-08-04 NOTE — Telephone Encounter (Signed)
Pt is having a procedure on his neck on 08/13/18 and has to be off his Xarelto for 3 days prior to the procedure. He wants to make sure that is OK for him.  The Procedure Doc said he can go back to taking it as soon as the procedure is completed

## 2018-08-04 NOTE — Telephone Encounter (Signed)
Call The pain clinic to see if they received clearance notes but there was no answer. I have left a voice message and will fax notes to requesting surgeon's office.   Returned pt's phone call to make pt aware that we have gotten in contact with the Pain Clinic and left a message and will fax over recommendations. I made pt aware that per Pharmacist recommendations. I told pt I jut wanted keep him updated and he was grateful for my call.

## 2018-08-05 ENCOUNTER — Telehealth: Payer: Self-pay | Admitting: Nurse Practitioner

## 2018-08-05 NOTE — Telephone Encounter (Signed)
New Message   Felicia from [facility] calling for clearance to be faxed over to her at [phone].  You can reach her at [phone] opt. [phone].

## 2018-08-10 NOTE — Telephone Encounter (Signed)
Faxed over clearance notes to requesting surgeon's office.

## 2018-08-13 DIAGNOSIS — M47812 Spondylosis without myelopathy or radiculopathy, cervical region: Secondary | ICD-10-CM | POA: Diagnosis not present

## 2018-08-16 ENCOUNTER — Ambulatory Visit: Payer: BLUE CROSS/BLUE SHIELD | Admitting: Nurse Practitioner

## 2018-08-17 ENCOUNTER — Ambulatory Visit: Payer: BLUE CROSS/BLUE SHIELD | Admitting: Nurse Practitioner

## 2018-08-20 DIAGNOSIS — I2510 Atherosclerotic heart disease of native coronary artery without angina pectoris: Secondary | ICD-10-CM | POA: Diagnosis not present

## 2018-08-20 DIAGNOSIS — R82998 Other abnormal findings in urine: Secondary | ICD-10-CM | POA: Diagnosis not present

## 2018-08-20 DIAGNOSIS — Z125 Encounter for screening for malignant neoplasm of prostate: Secondary | ICD-10-CM | POA: Diagnosis not present

## 2018-08-20 DIAGNOSIS — I480 Paroxysmal atrial fibrillation: Secondary | ICD-10-CM | POA: Diagnosis not present

## 2018-08-20 DIAGNOSIS — M5030 Other cervical disc degeneration, unspecified cervical region: Secondary | ICD-10-CM | POA: Diagnosis not present

## 2018-08-20 DIAGNOSIS — E119 Type 2 diabetes mellitus without complications: Secondary | ICD-10-CM | POA: Diagnosis not present

## 2018-08-20 DIAGNOSIS — E781 Pure hyperglyceridemia: Secondary | ICD-10-CM | POA: Diagnosis not present

## 2018-08-20 DIAGNOSIS — Z0000 Encounter for general adult medical examination without abnormal findings: Secondary | ICD-10-CM | POA: Diagnosis not present

## 2018-09-03 ENCOUNTER — Ambulatory Visit: Payer: BLUE CROSS/BLUE SHIELD | Admitting: Podiatry

## 2018-09-06 ENCOUNTER — Telehealth: Payer: Self-pay | Admitting: *Deleted

## 2018-09-06 NOTE — Telephone Encounter (Signed)
Virtual Visit Pre-Appointment Phone Call  Steps For Call:  1. Confirm consent - "In the setting of the current Covid19 crisis, you are scheduled for a (doximity, phone) visit with your provider on (Monday, May 11) at (8:30 am).  Just as we do with many in-office visits, in order for you to participate in this visit, we must obtain consent.  If you'd like, I can send this to your mychart (if signed up) or email for you to review.  Otherwise, I can obtain your verbal consent now.  All virtual visits are billed to your insurance company just like a normal visit would be.  By agreeing to a virtual visit, we'd like you to understand that the technology does not allow for your provider to perform an examination, and thus may limit your provider's ability to fully assess your condition.  Finally, though the technology is pretty good, we cannot assure that it will always work on either your or our end, and in the setting of a video visit, we may have to convert it to a phone-only visit.  In either situation, we cannot ensure that we have a secure connection.  Are you willing to proceed?"  2. Give patient instructions for WebEx download to smartphone as below if video visit  3. Advise patient to be prepared with any vital sign or heart rhythm information, their current medicines, and a piece of paper and pen handy for any instructions they may receive the day of their visit  4. Inform patient they will receive a phone call 15 minutes prior to their appointment time (may be from unknown caller ID) so they should be prepared to answer  5. Confirm that appointment type is correct in Epic appointment notes (video vs telephone)    TELEPHONE CALL NOTE  Jared [name] has been deemed a candidate for a follow-up tele-health visit to limit community exposure during the Covid-19 pandemic. I spoke with the patient via phone to ensure availability of phone/video source, confirm preferred email & phone number, and  discuss instructions and expectations.  I reminded Nylen R Kumpf to be prepared with any vital sign and/or heart rhythm information that could potentially be obtained via home monitoring, at the time of his visit. I reminded Stalin R Alton to expect a phone call at the time of his visit if his visit.  Did the patient verbally acknowledge consent to treatment? Yes.   Leanord Asal 09/06/2018 12:54 PM   DOWNLOADING THE WEBEX SOFTWARE TO SMARTPHONE  - If Apple, go to [employer] and type in WebEx in the search bar. Download [employer], the blue/green circle. The app is free but as with any other app downloads, their phone may require them to verify saved payment information or Apple password. The patient does NOT have to create an account.  - If Android, ask patient to go to [employer] and type in WebEx in the search bar. Download [employer], the blue/green circle. The app is free but as with any other app downloads, their phone may require them to verify saved payment information or Android password. The patient does NOT have to create an account.   CONSENT FOR TELE-HEALTH VISIT - PLEASE REVIEW  I hereby voluntarily request, consent and authorize [facility] and its employed or contracted physicians, physician assistants, nurse practitioners or other licensed health care professionals (the Practitioner), to provide me with telemedicine health care services (the "Services") as deemed necessary by the treating  Practitioner. I acknowledge and consent to receive the Services by the Practitioner via telemedicine. I understand that the telemedicine visit will involve communicating with the Practitioner through live audiovisual communication technology and the disclosure of certain medical information by electronic transmission. I acknowledge that I have been given the opportunity to request an in-person assessment or other available alternative prior to the telemedicine visit  and am voluntarily participating in the telemedicine visit.  I understand that I have the right to withhold or withdraw my consent to the use of telemedicine in the course of my care at any time, without affecting my right to future care or treatment, and that the Practitioner or I may terminate the telemedicine visit at any time. I understand that I have the right to inspect all information obtained and/or recorded in the course of the telemedicine visit and may receive copies of available information for a reasonable fee.  I understand that some of the potential risks of receiving the Services via telemedicine include:  Marland Kitchen Delay or interruption in medical evaluation due to technological equipment failure or disruption; . Information transmitted may not be sufficient (e.g. poor resolution of images) to allow for appropriate medical decision making by the Practitioner; and/or  . In rare instances, security protocols could fail, causing a breach of personal health information.  Furthermore, I acknowledge that it is my responsibility to provide information about my medical history, conditions and care that is complete and accurate to the best of my ability. I acknowledge that Practitioner's advice, recommendations, and/or decision may be based on factors not within their control, such as incomplete or inaccurate data provided by me or distortions of diagnostic images or specimens that may result from electronic transmissions. I understand that the practice of medicine is not an exact science and that Practitioner makes no warranties or guarantees regarding treatment outcomes. I acknowledge that I will receive a copy of this consent concurrently upon execution via email to the email address I last provided but may also request a printed copy by calling the office of [place].    I understand that my insurance will be billed for this visit.   I have read or had this consent read to me. . I understand  the contents of this consent, which adequately explains the benefits and risks of the Services being provided via telemedicine.  . I have been provided ample opportunity to ask questions regarding this consent and the Services and have had my questions answered to my satisfaction. . I give my informed consent for the services to be provided through the use of telemedicine in my medical care  By participating in this telemedicine visit I agree to the above.

## 2018-09-10 ENCOUNTER — Other Ambulatory Visit: Payer: Self-pay | Admitting: Nurse Practitioner

## 2018-09-16 ENCOUNTER — Ambulatory Visit: Payer: BLUE CROSS/BLUE SHIELD | Admitting: Podiatry

## 2018-09-17 ENCOUNTER — Encounter: Payer: Self-pay | Admitting: Podiatry

## 2018-09-17 ENCOUNTER — Ambulatory Visit: Payer: BLUE CROSS/BLUE SHIELD | Admitting: Podiatry

## 2018-09-17 ENCOUNTER — Other Ambulatory Visit: Payer: Self-pay

## 2018-09-17 VITALS — Temp 97.7°F

## 2018-09-17 DIAGNOSIS — B351 Tinea unguium: Secondary | ICD-10-CM

## 2018-09-17 DIAGNOSIS — Q828 Other specified congenital malformations of skin: Secondary | ICD-10-CM | POA: Diagnosis not present

## 2018-09-17 DIAGNOSIS — D689 Coagulation defect, unspecified: Secondary | ICD-10-CM | POA: Diagnosis not present

## 2018-09-17 DIAGNOSIS — Z79899 Other long term (current) drug therapy: Secondary | ICD-10-CM

## 2018-09-17 MED ORDER — FLUCONAZOLE 150 MG PO TABS: 150.0000 mg | ORAL_TABLET | ORAL | 1 refills | Status: DC

## 2018-09-17 MED ORDER — CICLOPIROX 8 % EX SOLN: Freq: Every day | CUTANEOUS | 0 refills | Status: DC

## 2018-09-17 NOTE — Progress Notes (Signed)
Subjective:  Patient ID: Jared [name], male    DOB: 10/28/1955,  MRN: [id number]  Chief Complaint  Patient presents with  . Debridement    Trim toenails  . Callouses    Plantar forefoot left - trim calluses  . Medication Refill    Requesting refill on penlac and fluconazole Rx    63 y.o. male presents with the above complaint. States the medications for his nails are helping them.  Review of Systems: Negative except as noted in the HPI. Denies N/V/F/Ch.  Past Medical History:  Diagnosis Date  . Chronic back pain   . Coronary artery disease    a. CABGx2 Left internal mammary artery to the LAD and a saphenous vein graft to the first diagonal with apparent intermediate coronary that was felt to be too small to bypass. b. Abnl stress test 05/2017 s/p DES to mid LCx, DES to ramus intermediate, nondominant RCA too small for PCI (med rx), normal EF.  Marland Kitchen Depression   . ED (erectile dysfunction)   . Fatigue   . Hyperlipidemia   . Hypertension   . Osteoarthritis    "qwhere" (06/24/2017)  . Sinus bradycardia     Current Outpatient Medications:  .  acetaminophen (TYLENOL) 500 MG tablet, Take 1,000 mg by mouth every 6 (six) hours as needed for mild pain (back)., Disp: , Rfl:  .  aspirin EC 81 MG tablet, Take 1 tablet (81 mg total) by mouth daily., Disp: 90 tablet, Rfl: 3 .  ciclopirox (PENLAC) 8 % solution, Apply topically at bedtime. Apply over nail and surrounding skin. Apply daily over previous coat. Remove weekly with file or polish remover., Disp: 6.6 mL, Rfl: 0 .  fluconazole (DIFLUCAN) 150 MG tablet, Take 1 tablet (150 mg total) by mouth once a week., Disp: 6 tablet, Rfl: 1 .  gabapentin (NEURONTIN) 600 MG tablet, Take 600 mg by mouth 3 (three) times daily. , Disp: , Rfl:  .  isosorbide mononitrate (IMDUR) 30 MG 24 hr tablet, Take 1 tablet (30 mg total) by mouth daily., Disp: 30 tablet, Rfl: 6 .  lidocaine (LIDODERM) 5 %, Apply patch to painful area. Patch may remain in place for up to  12 hours in a 24 hour period., Disp: , Rfl:  .  methocarbamol (ROBAXIN) 750 MG tablet, TAKE 1 TABLET(750 MG) BY MOUTH THREE TIMES DAILY AS NEEDED, Disp: , Rfl:  .  metoprolol tartrate (LOPRESSOR) 25 MG tablet, TAKE 1 TABLET(25 MG) BY MOUTH TWICE DAILY, Disp: 180 tablet, Rfl: 2 .  nitroGLYCERIN (NITROSTAT) 0.4 MG SL tablet, PLACE 1 TABLET UNDER THE TONGUE EVERY 5 MINUTES AS NEEDED FOR CHEST PAIN, Disp: 25 tablet, Rfl: 3 .  rosuvastatin (CRESTOR) 5 MG tablet, Take 1 tablet (5 mg total) by mouth daily. (Patient taking differently: Take 5 mg by mouth every evening. ), Disp: 90 tablet, Rfl: 3 .  sertraline (ZOLOFT) 100 MG tablet, TK 1 T PO QD, Disp: , Rfl:  .  sertraline (ZOLOFT) 50 MG tablet, Take 50 mg by mouth daily.  , Disp: , Rfl:  .  tiZANidine (ZANAFLEX) 4 MG tablet, Take 4 mg by mouth at bedtime. , Disp: , Rfl: 0 .  VASCEPA 1 g CAPS, Take 1 g by mouth 2 (two) times daily. , Disp: , Rfl:  .  XARELTO 20 MG TABS tablet, TAKE 1 TABLET BY MOUTH EVERY EVENING WITH FOOD, Disp: 30 tablet, Rfl: 10  Social History   Tobacco Use  Smoking Status Never Smoker  Smokeless Tobacco  Never Used    Allergies  Allergen Reactions  . Zocor [Simvastatin] Other (See Comments)    myalgias   Objective:   Vitals:   09/17/18 0841  Temp: 97.7 F (36.5 C)   There is no height or weight on file to calculate BMI. Constitutional Well developed. Well nourished.  Vascular Dorsalis pedis pulses palpable bilaterally. Posterior tibial pulses palpable bilaterally. Capillary refill normal to all digits.  No cyanosis or clubbing noted. Pedal hair growth normal.  Neurologic Normal speech. Oriented to person, place, and time. Epicritic sensation to light touch grossly present bilaterally.  Dermatologic Nails elongated dystrophic pain to palpation No open wounds. Left plantar 1st interspace punctate keratoses x3  Orthopedic: Normal joint ROM without pain or crepitus bilaterally. No visible deformities. No bony  tenderness.   Radiographs: None Assessment:   1. Onychomycosis   2. Coagulation defect (HCC)   3. Porokeratosis   4. Encounter for long-term (current) use of high-risk medication    Plan:  Patient was evaluated and treated and all questions answered.  Onychomycosis with pain, coag defect -Nails palliatively debridement as below -Educated on self-care -Refill fluconazole and penlac  Procedure: Nail Debridement Rationale: coag defect Type of Debridement: manual, sharp debridement. Instrumentation: Nail nipper, rotary burr. Number of Nails: 10  Porokeratoses -Debrided x3  No follow-ups on file.

## 2018-09-24 ENCOUNTER — Telehealth: Payer: BLUE CROSS/BLUE SHIELD | Admitting: Nurse Practitioner

## 2018-10-01 ENCOUNTER — Other Ambulatory Visit: Payer: Self-pay | Admitting: Nurse Practitioner

## 2018-10-03 NOTE — Progress Notes (Signed)
Telehealth Visit     Virtual Visit via Video Note   This visit type was conducted due to national recommendations for restrictions regarding the COVID-19 Pandemic (e.g. social distancing) in an effort to limit this patient's exposure and mitigate transmission in our community.  Due to his co-morbid illnesses, this patient is at least at moderate risk for complications without adequate follow up.  This format is felt to be most appropriate for this patient at this time.  All issues noted in this document were discussed and addressed.  A limited physical exam was performed with this format.  Please refer to the patient's chart for his consent to telehealth for [facility].   Evaluation Performed:  Follow-up visit  This visit type was conducted due to national recommendations for restrictions regarding the COVID-19 Pandemic (e.g. social distancing).  This format is felt to be most appropriate for this patient at this time.  All issues noted in this document were discussed and addressed.  No physical exam was performed (except for noted visual exam findings with Video Visits).  Please refer to the patient's chart (MyChart message for video visits and phone note for telephone visits) for the patient's consent to telehealth for [facility].  Date:  10/04/2018   ID:  Jared [name], DOB 08/14/1955, MRN [id number]  Patient Location:  Work  Provider location:   Home  PCP:  Tisovec, Adelfa Kohichard W, MD  Cardiologist:  Tyrone SageGerhardt & Nahser Electrophysiologist:  None   Chief Complaint:  Follow up visit.   History of Present Illness:    Jared [name] is a 63 y.o. male who presents via Web designeraudio/video conferencing for a telehealth visit today.  Seen for Dr. Elease HashimotoNahser. Former patient of Dr. Ronnald Nianennant's.Primarily follows with me.  He has known CAD with prior CABG in 2009 with LIMA to LAD and SVG to DX. Intermediate vessel too small to bypass. [place] in [place].Other issues include ED, HTN and HLD.No  prior history of MI noted.  SeeninNovemberof 2018- some chest pain reported - somewhat similar to prior chest pain syndrome - referred for Myoview - this was abnormal.He was working out of town and was not able to come back for follow up untilJanuaryof 2019- then referred for cath -ended up having2 vessel PCI.On DAPT.Did well following his PCI but last seen in October - some chest pain and heart "up and down". Soundedsomewhatsimilar to his prior chest pain syndrome. I added Imdurto his regimen - which did help. He then started having palpitations - placed an event monitor which showed AF/flutter - paroxsymal. Plavix was stopped and he was started on Xarelto.   Last seen by me back in December - he was doing well. On aspirin (along with his Xarelto) as well for his CAD/prior stent. He was referred for sleep study. Cardiac status ok.   The patient does not have symptoms concerning for COVID-19 infection (fever, chills, cough, or new shortness of breath).   Seen today via Doximity video. He has consented for this visit. He is at work on a [employer] site. He says he is feeling well. BP was checked last week and "was good". Tolerating his medicines. No bleeding/excessive bruising. No "spells". Not dizzy or lightheaded. No palpitations. No chest pain. Breathing is good. No real concerns. He did not follow thru with his sleep study.   Past Medical History:  Diagnosis Date  . Chronic back pain   . Coronary artery disease    a. CABGx2 Left internal mammary artery to  the LAD and a saphenous vein graft to the first diagonal with apparent intermediate coronary that was felt to be too small to bypass. b. Abnl stress test 05/2017 s/p DES to mid LCx, DES to ramus intermediate, nondominant RCA too small for PCI (med rx), normal EF.  Marland Kitchen Depression   . ED (erectile dysfunction)   . Fatigue   . Hyperlipidemia   . Hypertension   . Osteoarthritis    "qwhere" (06/24/2017)  . Sinus bradycardia     Past Surgical History:  Procedure Laterality Date  . CARDIAC CATHETERIZATION  04/18/2008   severe stenosis in the left anterior descending and what was described at that point as the first and second diagonal vessels.  The right coronary artery was nondominant .  The left circumfles had what was felt to be moderate sesions in its midportions  . CORONARY ANGIOPLASTY WITH STENT PLACEMENT  06/24/2017  . CORONARY ARTERY BYPASS GRAFT  04/19/2008   x2 -- EF of 70% -- left internal mammary artery to the LAD and a saphenous vein graft to the first diagonal with apparent intermediate coronary that was felt to be too small to bypass  . CORONARY STENT INTERVENTION N/A 06/24/2017   Procedure: CORONARY STENT INTERVENTION;  Surgeon: Swaziland, Peter M, MD;  Location: [facility] INVASIVE CV LAB;  Service: Cardiovascular;  Laterality: N/A;  . JOINT REPLACEMENT    . LEFT HEART CATH AND CORS/GRAFTS ANGIOGRAPHY N/A 06/24/2017   Procedure: LEFT HEART CATH AND CORS/GRAFTS ANGIOGRAPHY;  Surgeon: Swaziland, Peter M, MD;  Location[facility] INVASIVE CV LAB;  Service: Cardiovascular;  Laterality: N/A;  . REVISION TOTAL HIP ARTHROPLASTY Left 01/2014   "@ [address]"  . TOTAL HIP ARTHROPLASTY Left 12/2005     Current Meds  Medication Sig  . acetaminophen (TYLENOL) 500 MG tablet Take 1,000 mg by mouth every 6 (six) hours as needed for mild pain (back).  Marland Kitchen aspirin EC 81 MG tablet Take 1 tablet (81 mg total) by mouth daily.  . ciclopirox (PENLAC) 8 % solution Apply topically at bedtime. Apply over nail and surrounding skin. Apply daily over previous coat. Remove weekly with file or polish remover.  . gabapentin (NEURONTIN) 600 MG tablet Take 600 mg by mouth 3 (three) times daily.   . isosorbide mononitrate (IMDUR) 30 MG 24 hr tablet TAKE 1 TABLET(30 MG) BY MOUTH DAILY  . lidocaine (LIDODERM) 5 % Apply patch to painful area. Patch may remain in place for up to 12 hours in a 24 hour period.  . methocarbamol (ROBAXIN) 750 MG tablet TAKE 1  TABLET(750 MG) BY MOUTH THREE TIMES DAILY AS NEEDED  . metoprolol tartrate (LOPRESSOR) 25 MG tablet TAKE 1 TABLET(25 MG) BY MOUTH TWICE DAILY  . nitroGLYCERIN (NITROSTAT) 0.4 MG SL tablet PLACE 1 TABLET UNDER THE TONGUE EVERY 5 MINUTES AS NEEDED FOR CHEST PAIN  . rosuvastatin (CRESTOR) 5 MG tablet Take 5 mg by mouth at bedtime.  . sertraline (ZOLOFT) 50 MG tablet Take 50 mg by mouth daily.    Marland Kitchen tiZANidine (ZANAFLEX) 4 MG tablet Take 4 mg by mouth at bedtime.   Marland Kitchen VASCEPA 1 g CAPS Take 1 g by mouth 2 (two) times daily.   Carlena Hurl 20 MG TABS tablet TAKE 1 TABLET BY MOUTH EVERY EVENING WITH FOOD     Allergies:   Zocor [simvastatin]   Social History   Tobacco Use  . Smoking status: Never Smoker  . Smokeless tobacco: Never Used  Substance Use Topics  . Alcohol use: No  .  Drug use: No     Family Hx: The patient's family history includes Edema in his mother.  ROS:   Please see the history of present illness.   All other systems reviewed are negative.    Objective:    Vital Signs:  There were no vitals taken for this visit.   Wt Readings from Last 3 Encounters:  05/18/18 191 lb 12.8 oz (87 kg)  04/12/18 188 lb (85.3 kg)  03/03/18 189 lb 12.8 oz (86.1 kg)    Alert male in no acute distress. He is not short of breath with conversation. Wearing a hard hat.    Labs/Other Tests and Data Reviewed:    Lab Results  Component Value Date   WBC 6.6 05/18/2018   HGB 13.8 05/18/2018   HCT 40.4 05/18/2018   PLT 273 05/18/2018   GLUCOSE 150 (H) 05/18/2018   CHOL 162 03/03/2018   TRIG 258 (H) 03/03/2018   HDL 51 03/03/2018   LDLDIRECT 95.1 12/26/2010   LDLCALC 59 03/03/2018   ALT 14 03/03/2018   AST 14 03/03/2018   NA 137 05/18/2018   K 3.9 05/18/2018   CL 101 05/18/2018   CREATININE 0.79 05/18/2018   BUN 16 05/18/2018   CO2 23 05/18/2018   TSH 1.690 04/12/2018   INR 1.1 06/22/2017        BNP (last 3 results) No results for input(s): BNP in the last 8760 hours.   ProBNP (last 3 results) No results for input(s): PROBNP in the last 8760 hours.    Prior CV studies:    The following studies were reviewed today:  Result Notes for CARDIAC EVENT MONITOR   Notes recorded by Nahser, Deloris Ping, MD on 05/17/2018 at 5:20 PM EST Sinus rhythm with occasional episodes of atrial flutter and atrial fib  The atrial fib is known He is seeing Lawson Fiscal tomorrow    CORONARY STENT INTERVENTION1/2019  LEFT HEART CATH AND CORS/GRAFTS ANGIOGRAPHY  Conclusion     Prox RCA lesion is 95% stenosed.  Ost LAD to Prox LAD lesion is 100% stenosed.  Ost Ramus to Ramus lesion is 90% stenosed.  A drug-eluting stent was successfully placed using a STENT SYNERGY DES 2.5X20.  Post intervention, there is a 0% residual stenosis.  Prox Cx lesion is 95% stenosed.  Post intervention, there is a 0% residual stenosis.  A drug-eluting stent was successfully placed using a STENT SYNERGY DES 3.5X20.  SVG graft was visualized by angiography and is normal in caliber.  The graft exhibits no disease.  LIMA graft was visualized by angiography and is normal in caliber.  The graft exhibits no disease.  The left ventricular systolic function is normal.  LV end diastolic pressure is normal.  The left ventricular ejection fraction is 55-65% by visual estimate.  1. Severe 3 vessel obstructive CAD 2. Patent LIMA to the LAD 3. Patent SVG to the first diagonal 4. Normal LV function 5. Normal LVEDP 6. Successful PCI of the mid LCx- dominant vessel with DES 7. Successful PCI of the proximal ramus intermediate with DES  Plan: DAPT for one year. Anticipate DC in am. The nondominant RCA is too small for PCI and should be managed medically.    MyoviewStudy Highlights12/2018    Nuclear stress EF: 65%. There is septal wall akinesis (post CABG septal wall changes).  There was no ST segment deviation noted during stress.  Defect 1: There is a large defect of  moderate severity present in the basal inferior, mid inferior  and apical inferior location.  Findings consistent with prior myocardial infarction with peri-infarct ischemia.  This is an intermediate risk study.  Donato Schultz, MD     ASSESSMENT & PLAN:    1. PAF/atrial flutter - now on Xarelto - CHADSVASC of 2 - will be 3 with his upcoming  birthday. He is also on beta blocker. Will continue with current plan. He denies any palpitations.   2. CAD - prior CABG back in 2009 -with last cath following abnormal Myoview which led to2 vessel PCI tothe mid LCx- dominant vessel with DES and successful PCI of the proximal ramus intermediate with DESin January of 2019.He has residual disease. He remains on Imdur. No current chest pain. Plavix was stopped when Xarelto was started due to monitor report showing AF.    3. Residual RCA disease - too small for intervention - to treat medically -he has no active symptoms.   4. HTN -BP reportedly ok. No changes made.   5. HLD - he remains on statin therapy. His lab show LDL of 55 back in March.   5.  Resting bradycardia -with chronicborderline 1st degree AV block.Now noted to have PAF/flutter - will follow. Have not felt that EP referral needed at this time. He has no symptoms - will need to follow. EKG on return.   7. Chronic pain syndrome-followed by the pain clinic.Lots of back pain. Not discussed today.   8. Probable OSA - now with PAF/flutter, snoring, daytime somnolence - he has put off his sleep study - will discuss further on return.   9. COVID-19 Education: The signs and symptoms of COVID-19 were discussed with the patient and how to seek care for testing (follow up with PCP or arrange E-visit).  The importance of social distancing, staying at home, hand hygiene and wearing a mask when out in public were discussed today.  Patient Risk:   After full review of this patient's clinical status, I feel that they are at least  moderate risk at this time.  Time:   Today, I have spent 5 minutes with the patient with telehealth technology discussing the above issues.     Medication Adjustments/Labs and Tests Ordered: Current medicines are reviewed at length with the patient today.  Concerns regarding medicines are outlined above.   Tests Ordered: No orders of the defined types were placed in this encounter.   Medication Changes: No orders of the defined types were placed in this encounter.   Disposition:  FU with me in 4 months with EKG on return.   Patient is agreeable to this plan and will call if any problems develop in the interim.   Avelina Laine, NP  10/04/2018 8:52 AM    [facility]

## 2018-10-04 ENCOUNTER — Other Ambulatory Visit: Payer: Self-pay

## 2018-10-04 ENCOUNTER — Telehealth (INDEPENDENT_AMBULATORY_CARE_PROVIDER_SITE_OTHER): Payer: BLUE CROSS/BLUE SHIELD | Admitting: Nurse Practitioner

## 2018-10-04 ENCOUNTER — Encounter: Payer: Self-pay | Admitting: Nurse Practitioner

## 2018-10-04 DIAGNOSIS — I480 Paroxysmal atrial fibrillation: Secondary | ICD-10-CM | POA: Diagnosis not present

## 2018-10-04 DIAGNOSIS — I259 Chronic ischemic heart disease, unspecified: Secondary | ICD-10-CM

## 2018-10-04 DIAGNOSIS — I10 Essential (primary) hypertension: Secondary | ICD-10-CM

## 2018-10-04 DIAGNOSIS — Z7189 Other specified counseling: Secondary | ICD-10-CM

## 2018-10-04 NOTE — Patient Instructions (Addendum)
After Visit Summary:  We will be checking the following labs today -  NONE   Medication Instructions:    Continue with your current medicines.    If you need a refill on your cardiac medications before your next appointment, please call your pharmacy.     Testing/Procedures To Be Arranged:  N/A  Follow-Up:   See me in 4 months with EKG    At [facility], you and your health needs are our priority.  As part of our continuing mission to provide you with exceptional heart care, we have created designated Provider Care Teams.  These Care Teams include your primary Cardiologist (physician) and Advanced Practice Providers (APPs -  Physician Assistants and Nurse Practitioners) who all work together to provide you with the care you need, when you need it.  Special Instructions:  . Stay safe, stay home, wash your hands for at least 20 seconds and wear a mask when out in public.  . It was good to talk with you today.    Call the [facility] office at [phone] if you have any questions, problems or concerns.       

## 2018-10-15 DIAGNOSIS — M542 Cervicalgia: Secondary | ICD-10-CM | POA: Diagnosis not present

## 2018-10-15 DIAGNOSIS — M5412 Radiculopathy, cervical region: Secondary | ICD-10-CM | POA: Diagnosis not present

## 2018-10-15 DIAGNOSIS — M47812 Spondylosis without myelopathy or radiculopathy, cervical region: Secondary | ICD-10-CM | POA: Diagnosis not present

## 2018-10-15 DIAGNOSIS — M5136 Other intervertebral disc degeneration, lumbar region: Secondary | ICD-10-CM | POA: Diagnosis not present

## 2018-12-17 ENCOUNTER — Ambulatory Visit (INDEPENDENT_AMBULATORY_CARE_PROVIDER_SITE_OTHER): Payer: BLUE CROSS/BLUE SHIELD | Admitting: Podiatry

## 2018-12-17 DIAGNOSIS — Z5329 Procedure and treatment not carried out because of patient's decision for other reasons: Secondary | ICD-10-CM

## 2018-12-17 NOTE — Progress Notes (Signed)
   Complete physical exam  Patient: Jared [name]   DOB: 03/15/2001   63 y.o. Male  MRN: [id number]  Subjective:    No chief complaint on file.   Jared [name] is a 63 y.o. male who presents today for a complete physical exam. She reports consuming a {diet types:17450} diet. {types:19826} She generally feels {DESC; WELL/FAIRLY WELL/POORLY:18703}. She reports sleeping {DESC; WELL/FAIRLY WELL/POORLY:18703}. She {does/does not:200015} have additional problems to discuss today.    Most recent fall risk assessment:    11/20/2021   10:42 AM  Fall Risk   Falls in the past year? 0  Number falls in past yr: 0  Injury with Fall? 0  Risk for fall due to : No Fall Risks  Follow up Falls evaluation completed     Most recent depression screenings:    11/20/2021   10:42 AM 10/11/2020   10:46 AM  PHQ 2/9 Scores  PHQ - 2 Score 0 0  PHQ- 9 Score 5     {VISON DENTAL STD PSA (Optional):27386}  {History (Optional):23778}  Patient Care Team: Jessup, Joy, NP as PCP - General (Nurse Practitioner)   Outpatient Medications Prior to Visit  Medication Sig   fluticasone (FLONASE) 50 MCG/ACT nasal spray Place 2 sprays into both nostrils in the morning and at bedtime. After 7 days, reduce to once daily.   norgestimate-ethinyl estradiol (SPRINTEC 28) 0.25-35 MG-MCG tablet Take 1 tablet by mouth daily.   Nystatin POWD Apply liberally to affected area 2 times per day   spironolactone (ALDACTONE) 100 MG tablet Take 1 tablet (100 mg total) by mouth daily.   No facility-administered medications prior to visit.    ROS        Objective:     There were no vitals taken for this visit. {Vitals History (Optional):23777}  Physical Exam   No results found for any visits on 12/26/21. {Show previous labs (optional):23779}    Assessment & Plan:    Routine Health Maintenance and Physical Exam  Immunization History  Administered Date(s) Administered   DTaP 05/29/1999, 07/25/1999,  10/03/1999, 06/18/2000, 01/02/2004   Hepatitis A 10/29/2007, 11/03/2008   Hepatitis B 03/16/1999, 04/23/1999, 10/03/1999   HiB (PRP-OMP) 05/29/1999, 07/25/1999, 10/03/1999, 06/18/2000   IPV 05/29/1999, 07/25/1999, 03/23/2000, 01/02/2004   Influenza,inj,Quad PF,6+ Mos 02/03/2014   Influenza-Unspecified 05/05/2012   MMR 03/23/2001, 01/02/2004   Meningococcal Polysaccharide 11/03/2011   Pneumococcal Conjugate-13 06/18/2000   Pneumococcal-Unspecified 10/03/1999, 12/17/1999   Tdap 11/03/2011   Varicella 03/23/2000, 10/29/2007    Health Maintenance  Topic Date Due   HIV Screening  Never done   Hepatitis C Screening  Never done   INFLUENZA VACCINE  12/24/2021   PAP-Cervical Cytology Screening  12/26/2021 (Originally 03/14/2020)   PAP SMEAR-Modifier  12/26/2021 (Originally 03/14/2020)   TETANUS/TDAP  12/26/2021 (Originally 11/02/2021)   HPV VACCINES  Discontinued   COVID-19 Vaccine  Discontinued    Discussed health benefits of physical activity, and encouraged her to engage in regular exercise appropriate for her age and condition.  Problem List Items Addressed This Visit   None Visit Diagnoses     Annual physical exam    -  Primary   Cervical cancer screening       Need for Tdap vaccination          No follow-ups on file.     Joy Jessup, NP   

## 2019-02-08 ENCOUNTER — Telehealth: Payer: Self-pay

## 2019-02-08 NOTE — Telephone Encounter (Signed)
SAMPLES FROM 10/04/18 SENT BACK TO REFILL DEPARTMENT NEVER PICKED UP

## 2019-02-10 NOTE — Progress Notes (Deleted)
CARDIOLOGY OFFICE NOTE  Date:  02/10/2019    Jared [name] Date of Birth: 03-Mar-1956 Medical Record #[id number]  PCP:  Gaspar Garbeisovec, Richard W, MD  Cardiologist:  Tyrone SageGerhardt & Nahser   No chief complaint on file.   History of Present Illness: Jared [name] is a 63 y.o. male who presents today for a 4 month check. Seen for Dr. Elease HashimotoNahser. Former patient of Dr. Ronnald Nianennant's.Primarily follows with me.  He has known CAD with prior CABG in 2009 with LIMA to LAD and SVG to DX. Intermediate vessel too small to bypass. His surgery was in [place].Other issues include ED, HTN and HLD.No prior history of MI noted.  SeeninNovemberof 2018- some chest pain reported - somewhat similar to prior chest pain syndrome - referred for Myoview - this was abnormal.He was working out of town and was not able to come back for follow up untilJanuaryof 2019- then referred for cath -ended up having2 vessel PCI.On DAPT.Did well following his PCI but last seen in October - some chest pain and heart "up and down". Soundedsomewhatsimilar to his prior chest pain syndrome. I added Imdurto his regimen - which did help. He then started having palpitations - placed an event monitor which showed AF/flutter - paroxsymal. Plavix was stopped and he was started on Xarelto.   Last seen by me in the office back in December - he was doing well. On aspirin (along with his Xarelto) as well for his CAD/prior stent. He was referred for sleep study. Cardiac status ok. We did a telehealth visit back in May - he was doing well.  He did not follow thru with his sleep study.   The patient {does/does not:200015} have symptoms concerning for COVID-19 infection (fever, chills, cough, or new shortness of breath).   Comes in today. Here with   Past Medical History:  Diagnosis Date  . Chronic back pain   . Coronary artery disease    a. CABGx2 Left internal mammary artery to the LAD and a saphenous vein graft to the first diagonal  with apparent intermediate coronary that was felt to be too small to bypass. b. Abnl stress test 05/2017 s/p DES to mid LCx, DES to ramus intermediate, nondominant RCA too small for PCI (med rx), normal EF.  Marland Kitchen. Depression   . ED (erectile dysfunction)   . Fatigue   . Hyperlipidemia   . Hypertension   . Osteoarthritis    "qwhere" (06/24/2017)  . Sinus bradycardia     Past Surgical History:  Procedure Laterality Date  . CARDIAC CATHETERIZATION  04/18/2008   severe stenosis in the left anterior descending and what was described at that point as the first and second diagonal vessels.  The right coronary artery was nondominant .  The left circumfles had what was felt to be moderate sesions in its midportions  . CORONARY ANGIOPLASTY WITH STENT PLACEMENT  06/24/2017  . CORONARY ARTERY BYPASS GRAFT  04/19/2008   x2 -- EF of 70% -- left internal mammary artery to the LAD and a saphenous vein graft to the first diagonal with apparent intermediate coronary that was felt to be too small to bypass  . CORONARY STENT INTERVENTION N/A 06/24/2017   Procedure: CORONARY STENT INTERVENTION;  Surgeon: SwazilandJordan, Peter M, MD;  [facility];  Service: Cardiovascular;  Laterality: N/A;  . JOINT REPLACEMENT    . LEFT HEART CATH AND CORS/GRAFTS ANGIOGRAPHY N/A 06/24/2017   Procedure: LEFT HEART CATH AND CORS/GRAFTS ANGIOGRAPHY;  Surgeon:  Martinique, Peter M, MD;  Location: [facility] CV LAB;  Service: Cardiovascular;  Laterality: N/A;  . REVISION TOTAL HIP ARTHROPLASTY Left 01/2014   "@ [facility]"  . TOTAL HIP ARTHROPLASTY Left 12/2005     Medications: No outpatient medications have been marked as taking for the 02/15/19 encounter (Appointment) with Burtis Junes, NP.     Allergies: Allergies  Allergen Reactions  . Zocor [Simvastatin] Other (See Comments)    myalgias    Social History: The patient  reports that he has never smoked. He has never used smokeless tobacco. He reports that he does not  drink alcohol or use drugs.   Family History: The patient's ***family history includes Edema in his mother.   Review of Systems: Please see the history of present illness.   All other systems are reviewed and negative.   Physical Exam: VS:  There were no vitals taken for this visit. Marland Kitchen  BMI There is no height or weight on file to calculate BMI.  Wt Readings from Last 3 Encounters:  05/18/18 191 lb 12.8 oz (87 kg)  04/12/18 188 lb (85.3 kg)  03/03/18 189 lb 12.8 oz (86.1 kg)    General: Pleasant. Well developed, well nourished and in no acute distress.   HEENT: Normal.  Neck: Supple, no JVD, carotid bruits, or masses noted.  Cardiac: ***Regular rate and rhythm. No murmurs, rubs, or gallops. No edema.  Respiratory:  Lungs are clear to auscultation bilaterally with normal work of breathing.  GI: Soft and nontender.  MS: No deformity or atrophy. Gait and ROM intact.  Skin: Warm and dry. Color is normal.  Neuro:  Strength and sensation are intact and no gross focal deficits noted.  Psych: Alert, appropriate and with normal affect.   LABORATORY DATA:  EKG:  EKG {ACTION; IS/IS KDT:26712458} ordered today. This demonstrates ***.  Lab Results  Component Value Date   WBC 6.6 05/18/2018   HGB 13.8 05/18/2018   HCT 40.4 05/18/2018   PLT 273 05/18/2018   GLUCOSE 150 (H) 05/18/2018   CHOL 162 03/03/2018   TRIG 258 (H) 03/03/2018   HDL 51 03/03/2018   LDLDIRECT 95.1 12/26/2010   LDLCALC 59 03/03/2018   ALT 14 03/03/2018   AST 14 03/03/2018   NA 137 05/18/2018   K 3.9 05/18/2018   CL 101 05/18/2018   CREATININE 0.79 05/18/2018   BUN 16 05/18/2018   CO2 23 05/18/2018   TSH 1.690 04/12/2018   INR 1.1 06/22/2017       BNP (last 3 results) No results for input(s): BNP in the last 8760 hours.  ProBNP (last 3 results) No results for input(s): PROBNP in the last 8760 hours.   Other Studies Reviewed Today:  Result Notes for CARDIAC EVENT MONITOR   Notes recorded by  Nahser, Wonda Cheng, MD on 05/17/2018 at 5:20 PM EST Sinus rhythm with occasional episodes of atrial flutter and atrial fib  The atrial fib is known He is seeing Cecille Rubin tomorrow    CORONARY STENT INTERVENTION1/2019  LEFT HEART CATH AND CORS/GRAFTS ANGIOGRAPHY  Conclusion     Prox RCA lesion is 95% stenosed.  Ost LAD to Prox LAD lesion is 100% stenosed.  Ost Ramus to Ramus lesion is 90% stenosed.  A drug-eluting stent was successfully placed using a STENT SYNERGY DES 2.5X20.  Post intervention, there is a 0% residual stenosis.  Prox Cx lesion is 95% stenosed.  Post intervention, there is a 0% residual stenosis.  A drug-eluting stent was successfully  placed using a STENT SYNERGY DES 3.5X20.  SVG graft was visualized by angiography and is normal in caliber.  The graft exhibits no disease.  LIMA graft was visualized by angiography and is normal in caliber.  The graft exhibits no disease.  The left ventricular systolic function is normal.  LV end diastolic pressure is normal.  The left ventricular ejection fraction is 55-65% by visual estimate.  1. Severe 3 vessel obstructive CAD 2. Patent LIMA to the LAD 3. Patent SVG to the first diagonal 4. Normal LV function 5. Normal LVEDP 6. Successful PCI of the mid LCx- dominant vessel with DES 7. Successful PCI of the proximal ramus intermediate with DES  Plan: DAPT for one year. Anticipate DC in am. The nondominant RCA is too small for PCI and should be managed medically.    MyoviewStudy Highlights12/2018    Nuclear stress EF: 65%. There is septal wall akinesis (post CABG septal wall changes).  There was no ST segment deviation noted during stress.  Defect 1: There is a large defect of moderate severity present in the basal inferior, mid inferior and apical inferior location.  Findings consistent with prior myocardial infarction with peri-infarct ischemia.  This is an intermediate risk study.  Donato SchultzMark  Skains, MD     ASSESSMENT & PLAN:    1.PAF/atrial flutter - now on Xarelto - CHADSVASC of 2 - will be 3 with his upcoming  birthday. He is also on beta blocker. Will continue with current plan. He denies any palpitations.   2.CAD - prior CABG back in 2009 -with last cath following abnormal Myoview which led to2 vessel PCI tothe mid LCx- dominant vessel with DES and successful PCI of the proximal ramus intermediate with DESin January of 2019.He has residual disease. He remains on Imdur. No current chest pain. Plavix was stopped when Xarelto was started due to monitor report showing AF.   3. Residual RCA disease - too small for intervention - to treat medically -he has no active symptoms.   4. HTN -BP reportedly ok. No changes made.   5. HLD - he remains on statin therapy. His lab show LDL of 55 back in March.   5.Resting bradycardia -with chronicborderline 1st degree AV block.Now noted to have PAF/flutter - will follow. Have not felt that EP referral needed at this time.He has no symptoms - will need to follow. EKG on return.   7. Chronic pain syndrome-followed by the pain clinic.Lots of back pain. Not discussed today.   8. Probable OSA - now with PAF/flutter, snoring, daytime somnolence - he has put off his sleep study - will discuss further on return.   9. COVID-19 Education: The signs and symptoms of COVID-19 were discussed with the patient and how to seek care for testing (follow up with PCP or arrange E-visit).  The importance of social distancing, staying at home, hand hygiene and wearing a mask when out in public were discussed today.  Current medicines are reviewed with the patient today.  The patient does not have concerns regarding medicines other than what has been noted above.  The following changes have been made:  See above.  Labs/ tests ordered today include:   No orders of the defined types were placed in this encounter.     Disposition:   FU with *** in {gen number 1-61:096045}0-10:310397} {Days to years:10300}.   Patient is agreeable to this plan and will call if any problems develop in the interim.   Signed: Norma FredricksonLORI Carliss Porcaro, NP  02/10/2019 7:42 PM  [facility] [address] [place], [place]  [address] Phone: [phone] Fax: [phone]

## 2019-02-15 ENCOUNTER — Ambulatory Visit: Payer: BLUE CROSS/BLUE SHIELD | Admitting: Nurse Practitioner

## 2019-02-24 ENCOUNTER — Other Ambulatory Visit: Payer: Self-pay | Admitting: Nurse Practitioner

## 2019-02-24 MED ORDER — METOPROLOL TARTRATE 25 MG PO TABS: ORAL_TABLET | ORAL | 2 refills | Status: DC

## 2019-03-01 ENCOUNTER — Telehealth: Payer: Self-pay | Admitting: Nurse Practitioner

## 2019-03-01 NOTE — Telephone Encounter (Signed)
Hey Lynn, LPN, can you please advise on this matter? Thanks  ?

## 2019-03-01 NOTE — Telephone Encounter (Signed)
Patient calling the office for samples of medication:   1.  What medication and dosage are you requesting samples for?  Xarelto  2.  Are you currently out of this medication?  1 week left- Pt says his insurance will not pay anymore until 03-27-19. If you do not have samples, is there anything else he can take or can he go without his  Xarelto until 03-27-19?

## 2019-03-01 NOTE — Telephone Encounter (Signed)
I called [facility] pharmacy and was advised that the pt currently has no Ins coverage and that he purchased 1 weeks worth of Xarelto that cost him $100.  I called the pt and he states that his new Ins policy does start on 50/0/3704 and that he cannot afford to pay out of pocket for more.  He is advised that we are leaving him 3 bottles of Xarelto samples at our Covid-19 screening table in the downstairs lobby at [facility] office which is enough to last him until his new Ins coverage starts.  He states that a friend name Alvira Philips is coming to pick his Xarelto samples up. He thanked me for the office assisting him with this.

## 2019-03-24 NOTE — Progress Notes (Signed)
Virtual Visit via Video Note   This visit type was conducted due to national recommendations for restrictions regarding the COVID-19 Pandemic (e.g. social distancing) in an effort to limit this patient's exposure and mitigate transmission in our community.  Due to his co-morbid illnesses, this patient is at least at moderate risk for complications without adequate follow up.  This format is felt to be most appropriate for this patient at this time.  All issues noted in this document were discussed and addressed.  A limited physical exam was performed with this format.  Please refer to the patient's chart for his consent to telehealth for [facility].   Evaluation Performed:  Follow-up visit  This visit type was conducted due to national recommendations for restrictions regarding the COVID-19 Pandemic (e.g. social distancing).  This format is felt to be most appropriate for this patient at this time.  All issues noted in this document were discussed and addressed.  No physical exam was performed (except for noted visual exam findings with Video Visits).  Please refer to the patient's chart (MyChart message for video visits and phone note for telephone visits) for the patient's consent to telehealth for [facility].   Date:  03/29/2019    Jared [name] Date of Birth: Dec 18, 1955 Medical Record #[id number]  PCP:  Gaspar Garbeisovec, Richard W, MD  Cardiologist:  Tyrone SageGerhardt & Nahser   Patient Location:  Work  Provider location:   Home   History of Present Illness: Jared [name] is a 63 y.o. male who presents today for a 4 month visit - patient preferred virtual telehealth check.  Seen for Dr. Elease HashimotoNahser. Former patient of Dr. Ronnald Nianennant's.Primarily follows with me.  He has known CAD with prior CABG in 2009 with LIMA to LAD and SVG to DX. Intermediate vessel too small to bypass. His surgery was in [place].Other issues include ED, HTN and HLD.No prior history of MI noted.  SeeninNovemberof  2018- some chest pain reported - somewhat similar to prior chest pain syndrome - referred for Myoview - this was abnormal.He was working out of town and was not able to come back for follow up untilJanuaryof 2019- then referred for cath -ended up having2 vessel PCI.On DAPT.Did well following his PCI but last seen in October - some chest pain and heart "up and down". Soundedsomewhatsimilar to his prior chest pain syndrome. I added Imdurto his regimen - which did help. He then started having palpitations - placed an event monitor which showed AF/flutter - paroxsymal. Plavix was stopped and he was started on Xarelto.   Last seen by me here in the office in December of 2019 - he was doing well. On aspirin for CAD/prior stent along with his Xarelto for AF. He was referred for sleep study. Cardiac status ok. We did a telehealth visit in May - he was doing ok. He did not follow thru with his sleep study.   The patient does not have symptoms concerning for COVID-19 infection (fever, chills, cough, or new shortness of breath).   Seen today by Face time video - he has consented previously for this visit. Doing well. Needs his Crestor refill today. He is busy with work. Has changed company's - he is happy about this. No chest pain. Breathing is good. Not dizzy. He feels like his rhythm is ok. No bleeding. He is walking more with his new job - he is happy about this. Overall, no real concerns.    Past Medical History:  Diagnosis Date  .  Chronic back pain   . Coronary artery disease    a. CABGx2 Left internal mammary artery to the LAD and a saphenous vein graft to the first diagonal with apparent intermediate coronary that was felt to be too small to bypass. b. Abnl stress test 05/2017 s/p DES to mid LCx, DES to ramus intermediate, nondominant RCA too small for PCI (med rx), normal EF.  Marland Kitchen Depression   . ED (erectile dysfunction)   . Fatigue   . Hyperlipidemia   . Hypertension   . Osteoarthritis     "qwhere" (06/24/2017)  . Sinus bradycardia     Past Surgical History:  Procedure Laterality Date  . CARDIAC CATHETERIZATION  04/18/2008   severe stenosis in the left anterior descending and what was described at that point as the first and second diagonal vessels.  The right coronary artery was nondominant .  The left circumfles had what was felt to be moderate sesions in its midportions  . CORONARY ANGIOPLASTY WITH STENT PLACEMENT  06/24/2017  . CORONARY ARTERY BYPASS GRAFT  04/19/2008   x2 -- EF of 70% -- left internal mammary artery to the LAD and a saphenous vein graft to the first diagonal with apparent intermediate coronary that was felt to be too small to bypass  . CORONARY STENT INTERVENTION N/A 06/24/2017   Procedure: CORONARY STENT INTERVENTION;  Surgeon: Swaziland, Peter M, MD;  Location: [facility] INVASIVE CV LAB;  Service: Cardiovascular;  Laterality: N/A;  . JOINT REPLACEMENT    . LEFT HEART CATH AND CORS/GRAFTS ANGIOGRAPHY N/A 06/24/2017   Procedure: LEFT HEART CATH AND CORS/GRAFTS ANGIOGRAPHY;  Surgeon: Swaziland, Peter M, MD;  Location: [facility] INVASIVE CV LAB;  Service: Cardiovascular;  Laterality: N/A;  . REVISION TOTAL HIP ARTHROPLASTY Left 01/2014   "@ [address]"  . TOTAL HIP ARTHROPLASTY Left 12/2005     Medications: Current Meds  Medication Sig  . acetaminophen (TYLENOL) 500 MG tablet Take 1,000 mg by mouth every 6 (six) hours as needed for mild pain (back).  Marland Kitchen aspirin EC 81 MG tablet Take 1 tablet (81 mg total) by mouth daily.  Marland Kitchen gabapentin (NEURONTIN) 600 MG tablet Take 600 mg by mouth 3 (three) times daily.   . isosorbide mononitrate (IMDUR) 30 MG 24 hr tablet TAKE 1 TABLET(30 MG) BY MOUTH DAILY  . metoprolol tartrate (LOPRESSOR) 25 MG tablet TAKE 1 TABLET(25 MG) BY MOUTH TWICE DAILY  . nitroGLYCERIN (NITROSTAT) 0.4 MG SL tablet PLACE 1 TABLET UNDER THE TONGUE EVERY 5 MINUTES AS NEEDED FOR CHEST PAIN  . rosuvastatin (CRESTOR) 5 MG tablet Take 1 tablet (5 mg total) by mouth at  bedtime.  . sertraline (ZOLOFT) 100 MG tablet Take 100 mg by mouth daily.   Marland Kitchen tiZANidine (ZANAFLEX) 4 MG tablet Take 4 mg by mouth at bedtime.   Marland Kitchen VASCEPA 1 g CAPS Take 1 g by mouth 2 (two) times daily.   Carlena Hurl 20 MG TABS tablet TAKE 1 TABLET BY MOUTH EVERY EVENING WITH FOOD  . [DISCONTINUED] rosuvastatin (CRESTOR) 5 MG tablet Take 5 mg by mouth at bedtime.  . [DISCONTINUED] sertraline (ZOLOFT) 50 MG tablet Take 50 mg by mouth daily.       Allergies: Allergies  Allergen Reactions  . Zocor [Simvastatin] Other (See Comments)    myalgias    Social History: The patient  reports that he has never smoked. He has never used smokeless tobacco. He reports that he does not drink alcohol or use drugs.   Family History: The patient's family  history includes Edema in his mother.   Review of Systems: Please see the history of present illness.   All other systems are reviewed and negative.   Physical Exam: VS:  BP 121/87   Pulse 75   Ht 5\' 8"  (1.727 m)   BMI 29.16 kg/m  .  BMI Body mass index is 29.16 kg/m.  Wt Readings from Last 3 Encounters:  05/18/18 191 lb 12.8 oz (87 kg)  04/12/18 188 lb (85.3 kg)  03/03/18 189 lb 12.8 oz (86.1 kg)     Alert male in no acute distress. He is not short of breath with conversation. He looks to be doing well.   LABORATORY DATA:   Lab Results  Component Value Date   WBC 6.6 05/18/2018   HGB 13.8 05/18/2018   HCT 40.4 05/18/2018   PLT 273 05/18/2018   GLUCOSE 150 (H) 05/18/2018   CHOL 162 03/03/2018   TRIG 258 (H) 03/03/2018   HDL 51 03/03/2018   LDLDIRECT 95.1 12/26/2010   LDLCALC 59 03/03/2018   ALT 14 03/03/2018   AST 14 03/03/2018   NA 137 05/18/2018   K 3.9 05/18/2018   CL 101 05/18/2018   CREATININE 0.79 05/18/2018   BUN 16 05/18/2018   CO2 23 05/18/2018   TSH 1.690 04/12/2018   INR 1.1 06/22/2017        BNP (last 3 results) No results for input(s): BNP in the last 8760 hours.  ProBNP (last 3 results) No  results for input(s): PROBNP in the last 8760 hours.   Other Studies Reviewed Today:  Result Notes for CARDIAC EVENT MONITOR   Notes recorded by Nahser, Wonda Cheng, MD on 05/17/2018 at 5:20 PM EST Sinus rhythm with occasional episodes of atrial flutter and atrial fib  The atrial fib is known He is seeing Cecille Rubin tomorrow    CORONARY STENT INTERVENTION1/2019  LEFT HEART CATH AND CORS/GRAFTS ANGIOGRAPHY  Conclusion     Prox RCA lesion is 95% stenosed.  Ost LAD to Prox LAD lesion is 100% stenosed.  Ost Ramus to Ramus lesion is 90% stenosed.  A drug-eluting stent was successfully placed using a STENT SYNERGY DES 2.5X20.  Post intervention, there is a 0% residual stenosis.  Prox Cx lesion is 95% stenosed.  Post intervention, there is a 0% residual stenosis.  A drug-eluting stent was successfully placed using a STENT SYNERGY DES 3.5X20.  SVG graft was visualized by angiography and is normal in caliber.  The graft exhibits no disease.  LIMA graft was visualized by angiography and is normal in caliber.  The graft exhibits no disease.  The left ventricular systolic function is normal.  LV end diastolic pressure is normal.  The left ventricular ejection fraction is 55-65% by visual estimate.  1. Severe 3 vessel obstructive CAD 2. Patent LIMA to the LAD 3. Patent SVG to the first diagonal 4. Normal LV function 5. Normal LVEDP 6. Successful PCI of the mid LCx- dominant vessel with DES 7. Successful PCI of the proximal ramus intermediate with DES  Plan: DAPT for one year. Anticipate DC in am. The nondominant RCA is too small for PCI and should be managed medically.    MyoviewStudy Highlights12/2018    Nuclear stress EF: 65%. There is septal wall akinesis (post CABG septal wall changes).  There was no ST segment deviation noted during stress.  Defect 1: There is a large defect of moderate severity present in the basal inferior, mid inferior and apical  inferior location.  Findings  consistent with prior myocardial infarction with peri-infarct ischemia.  This is an intermediate risk study.  Donato Schultz, MD     ASSESSMENT & PLAN:    1.PAF/flutter - on Xarelto - now with CHADSVASC of 3. He does not seem to be having any issue.   2. CAD - remote CABG in 2009 with cath and 2 vessel PCI to the mid LCX and proximal ramus intermediate in January of 2019. He has known residual disease - no active or worrisome symptoms noted. Remains on Imdur. The disease in the RCA is too small for intervention.   3. Chronic anticoagulation - no problems noted - he does need surveillance labs and will do on return visit.   4. HTN - BP is ok. No changes made.  5. HLD - on stsatin - labs from March noted.   6. History of resting bradycardia -with chronicborderline 1st degree AV block.Has also had PAF/flutter. Will need EKG on return. He has no worrisome symptoms.   7. Chronic pain syndrome-followed by the pain clinic.Lots of back pain. Not discussed today.   8. Probable OSA - he has not wished to have his sleep study.   9. COVID-19 Education: The signs and symptoms of COVID-19 were discussed with the patient and how to seek care for testing (follow up with PCP or arrange E-visit).  The importance of social distancing, staying at home, hand hygiene and wearing a mask when out in public were discussed today.  Patient Risk:   After full review of this patient's clinical status, I feel that they are at least moderate risk at this time.  Time:   Today, I have spent 5 minutes with the patient with telehealth technology discussing the above issues.    Current medicines are reviewed with the patient today.  The patient does not have concerns regarding medicines other than what has been noted above.  The following changes have been made:  See above.  Labs/ tests ordered today include:   No orders of the defined types were placed in this  encounter.    Disposition:   FU with me in March of 2021 with labs and EKG.   Patient is agreeable to this plan and will call if any problems develop in the interim.   SignedNorma Fredrickson, NP  03/29/2019 1:35 PM  [facility] [address] [place], [place]  [address] Phone: [phone] Fax: [phone]

## 2019-03-26 ENCOUNTER — Other Ambulatory Visit: Payer: Self-pay | Admitting: Nurse Practitioner

## 2019-03-28 ENCOUNTER — Telehealth: Payer: Self-pay | Admitting: Nurse Practitioner

## 2019-03-28 NOTE — Telephone Encounter (Signed)
New Message:    Pt wants to know if there anyway his visit tomorrow can be Virtual please?

## 2019-03-28 NOTE — Telephone Encounter (Signed)
S/w pt is advised a telehealth visit is ok for tomorrow.  Went over pt's instructions.

## 2019-03-29 ENCOUNTER — Telehealth (INDEPENDENT_AMBULATORY_CARE_PROVIDER_SITE_OTHER): Payer: 59 | Admitting: Nurse Practitioner

## 2019-03-29 ENCOUNTER — Encounter: Payer: Self-pay | Admitting: Nurse Practitioner

## 2019-03-29 ENCOUNTER — Other Ambulatory Visit: Payer: Self-pay

## 2019-03-29 VITALS — BP 121/87 | HR 75 | Ht 68.0 in

## 2019-03-29 DIAGNOSIS — Z79899 Other long term (current) drug therapy: Secondary | ICD-10-CM

## 2019-03-29 DIAGNOSIS — I480 Paroxysmal atrial fibrillation: Secondary | ICD-10-CM

## 2019-03-29 DIAGNOSIS — I259 Chronic ischemic heart disease, unspecified: Secondary | ICD-10-CM

## 2019-03-29 DIAGNOSIS — Z7189 Other specified counseling: Secondary | ICD-10-CM

## 2019-03-29 DIAGNOSIS — I10 Essential (primary) hypertension: Secondary | ICD-10-CM

## 2019-03-29 DIAGNOSIS — E7849 Other hyperlipidemia: Secondary | ICD-10-CM

## 2019-03-29 MED ORDER — ROSUVASTATIN CALCIUM 5 MG PO TABS: 5.0000 mg | ORAL_TABLET | Freq: Every day | ORAL | 3 refills | Status: DC

## 2019-03-29 NOTE — Patient Instructions (Addendum)
After Visit Summary:  We will be checking the following labs today - NONE   Medication Instructions:    Continue with your current medicines.   I sent in your refill for your Crestor today.    If you need a refill on your cardiac medications before your next appointment, please call your pharmacy.     Testing/Procedures To Be Arranged:  N/A  Follow-Up:   See me in 4 months with labs and EKG - Monday March 29th at [facility], you and your health needs are our priority.  As part of our continuing mission to provide you with exceptional heart care, we have created designated Provider Care Teams.  These Care Teams include your primary Cardiologist (physician) and Advanced Practice Providers (APPs -  Physician Assistants and Nurse Practitioners) who all work together to provide you with the care you need, when you need it.  Special Instructions:  . Stay safe, stay home, wash your hands for at least 20 seconds and wear a mask when out in public.  . It was good to talk with you today.    Call the [place] office at [phone] if you have any questions, problems or concerns.

## 2019-06-06 ENCOUNTER — Ambulatory Visit: Payer: 59 | Admitting: Internal Medicine

## 2019-07-04 ENCOUNTER — Other Ambulatory Visit: Payer: Self-pay | Admitting: Cardiovascular Disease

## 2019-07-04 NOTE — Telephone Encounter (Addendum)
Prescription refill request for Xarelto received.   Last office visit: Jared [name] 03/29/2019 Weight: 87 kg Age: 64 y.o. Scr: 0.8 ml/min, 08/20/2018 CrCl: 113 ml/min   Prescription refill sent.

## 2019-08-04 ENCOUNTER — Encounter: Payer: Self-pay | Admitting: Internal Medicine

## 2019-08-17 ENCOUNTER — Telehealth: Payer: Self-pay | Admitting: *Deleted

## 2019-08-17 NOTE — Progress Notes (Deleted)
CARDIOLOGY OFFICE NOTE  Date:  08/17/2019    Jared [name] Date of Birth: [birth year] Medical Record #[id number]  PCP:  Haywood Pao, MD  Cardiologist:  Servando Snare & ***    No chief complaint on file.   History of Present Illness: Jared [name] is a 64 y.o. male who presents today for a ***  The patient {does/does not:200015} have symptoms concerning for COVID-19 infection (fever, chills, cough, or new shortness of breath).   Comes in today. Here with   Past Medical History:  Diagnosis Date  . Chronic back pain   . Coronary artery disease    a. CABGx2 Left internal mammary artery to the LAD and a saphenous vein graft to the first diagonal with apparent intermediate coronary that was felt to be too small to bypass. b. Abnl stress test 05/2017 s/p DES to mid LCx, DES to ramus intermediate, nondominant RCA too small for PCI (med rx), normal EF.  Marland Kitchen Depression   . ED (erectile dysfunction)   . Fatigue   . Hyperlipidemia   . Hypertension   . Osteoarthritis    "qwhere" (06/24/2017)  . Sinus bradycardia     Past Surgical History:  Procedure Laterality Date  . CARDIAC CATHETERIZATION  04/18/2008   severe stenosis in the left anterior descending and what was described at that point as the first and second diagonal vessels.  The right coronary artery was nondominant .  The left circumfles had what was felt to be moderate sesions in its midportions  . CORONARY ANGIOPLASTY WITH STENT PLACEMENT  06/24/2017  . CORONARY ARTERY BYPASS GRAFT  04/19/2008   x2 -- EF of 70% -- left internal mammary artery to the LAD and a saphenous vein graft to the first diagonal with apparent intermediate coronary that was felt to be too small to bypass  . CORONARY STENT INTERVENTION N/A 06/24/2017   Procedure: CORONARY STENT INTERVENTION;  Surgeon: Martinique, Peter M, MD;  Location: [facility] CV [facility];  Service: Cardiovascular;  Laterality: N/A;  . JOINT REPLACEMENT    . LEFT HEART CATH AND CORS/GRAFTS  ANGIOGRAPHY N/A 06/24/2017   Procedure: LEFT HEART CATH AND CORS/GRAFTS ANGIOGRAPHY;  Surgeon: Martinique, Peter M, MD;  Location: [facility];  Service: Cardiovascular;  Laterality: N/A;  . REVISION TOTAL HIP ARTHROPLASTY Left 01/2014   "@ [facility]"  . TOTAL HIP ARTHROPLASTY Left 12/2005     Medications: No outpatient medications have been marked as taking for the 08/24/19 encounter (Appointment) with Burtis Junes, NP.     Allergies: Allergies  Allergen Reactions  . Zocor [Simvastatin] Other (See Comments)    myalgias    Social History: The patient  reports that he has never smoked. He has never used smokeless tobacco. He reports that he does not drink alcohol or use drugs.   Family History: The patient's ***family history includes Edema in his mother.   Review of Systems: Please see the history of present illness.   All other systems are reviewed and negative.   Physical Exam: VS:  There were no vitals taken for this visit. Marland Kitchen  BMI There is no height or weight on file to calculate BMI.  Wt Readings from Last 3 Encounters:  05/18/18 191 lb 12.8 oz (87 kg)  04/12/18 188 lb (85.3 kg)  03/03/18 189 lb 12.8 oz (86.1 kg)    General: Pleasant. Well developed, well nourished and in no acute distress.   HEENT: Normal.  Neck: Supple, no JVD,  carotid bruits, or masses noted.  Cardiac: ***Regular rate and rhythm. No murmurs, rubs, or gallops. No edema.  Respiratory:  Lungs are clear to auscultation bilaterally with normal work of breathing.  GI: Soft and nontender.  MS: No deformity or atrophy. Gait and ROM intact.  Skin: Warm and dry. Color is normal.  Neuro:  Strength and sensation are intact and no gross focal deficits noted.  Psych: Alert, appropriate and with normal affect.   LABORATORY DATA:  EKG:  EKG {ACTION; IS/IS MLY:[id number]} ordered today.  Personally reviewed by me. This demonstrates ***.  Lab Results  Component Value Date   WBC 6.6 05/18/2018   HGB 13.8  05/18/2018   HCT 40.4 05/18/2018   PLT 273 05/18/2018   GLUCOSE 150 (H) 05/18/2018   CHOL 162 03/03/2018   TRIG 258 (H) 03/03/2018   HDL 51 03/03/2018   LDLDIRECT 95.1 12/26/2010   LDLCALC 59 03/03/2018   ALT 14 03/03/2018   AST 14 03/03/2018   NA 137 05/18/2018   K 3.9 05/18/2018   CL 101 05/18/2018   CREATININE 0.79 05/18/2018   BUN 16 05/18/2018   CO2 23 05/18/2018   TSH 1.690 04/12/2018   INR 1.1 06/22/2017     BNP (last 3 results) No results for input(s): BNP in the last 8760 hours.  ProBNP (last 3 results) No results for input(s): PROBNP in the last 8760 hours.   Other Studies Reviewed Today:   Assessment/Plan:  . COVID-19 Education: The signs and symptoms of COVID-19 were discussed with the patient and how to seek care for testing (follow up with PCP or arrange E-visit).  The importance of social distancing, staying at home, hand hygiene and wearing a mask when out in public were discussed today.  Current medicines are reviewed with the patient today.  The patient does not have concerns regarding medicines other than what has been noted above.  The following changes have been made:  See above.  Labs/ tests ordered today include:   No orders of the defined types were placed in this encounter.    Disposition:   FU with *** in {gen number 6-81:275170} {Days to years:10300}.   Patient is agreeable to this plan and will call if any problems develop in the interim.   SignedNorma Fredrickson, NP  08/17/2019 7:57 AM  [facility] [address] [place], [place]  [address] Phone: [phone] Fax: [phone]

## 2019-08-17 NOTE — Progress Notes (Deleted)
CARDIOLOGY OFFICE NOTE  Date:  08/23/2019    Jared [name] Date of Birth: 1955/09/24 Medical Record #[id number]  PCP:  Haywood Pao, MD  Cardiologist:  Servando Snare & Nahser  No chief complaint on file.   History of Present Illness: Jared [name] is a 64 y.o. male who presents today for a follow up visit.  Seen for Dr. Acie Fredrickson. Former patient of Dr. Susa Simmonds.Primarily follows with me.  He has known CAD with prior CABG in 2009 with LIMA to LAD and SVG to DX. Intermediate vessel too small to bypass. His surgery was in [place].Other issues include ED, HTN and HLD.No prior history of MI noted.  SeeninNovemberof 2018- some chest pain reported - somewhat similar to prior chest pain syndrome - referred for Myoview - this was abnormal.He was working out of town and was not able to come back for follow up untilJanuaryof 2019- then referred for cath -ended up having2 vessel PCI.On DAPT.Did well following his PCI but last seen in October - some chest pain and heart "up and down". Soundedsomewhatsimilar to his prior chest pain syndrome. I added Imdurto his regimen- which did help. He then started having palpitations - placed an event monitor which showed AF/flutter - paroxsymal. Plavix was stopped and he was started on Xarelto.   Last seen by me here in the office in December of 2019 - he was doing well. On aspirin for CAD/prior stent along with his Xarelto for AF. He was referred for sleep study. Cardiac status ok.We did a telehealth visit in May and again in November - he was doing well. He did not follow thru with his sleep study.  The patient {does/does not:200015} have symptoms concerning for COVID-19 infection (fever, chills, cough, or new shortness of breath).   Comes in today. Here with   Past Medical History:  Diagnosis Date  . Chronic back pain   . Coronary artery disease    a. CABGx2 Left internal mammary artery to the LAD and a saphenous vein graft to  the first diagonal with apparent intermediate coronary that was felt to be too small to bypass. b. Abnl stress test 05/2017 s/p DES to mid LCx, DES to ramus intermediate, nondominant RCA too small for PCI (med rx), normal EF.  Marland Kitchen Depression   . ED (erectile dysfunction)   . Fatigue   . Hyperlipidemia   . Hypertension   . Osteoarthritis    "qwhere" (06/24/2017)  . Sinus bradycardia     Past Surgical History:  Procedure Laterality Date  . CARDIAC CATHETERIZATION  04/18/2008   severe stenosis in the left anterior descending and what was described at that point as the first and second diagonal vessels.  The right coronary artery was nondominant .  The left circumfles had what was felt to be moderate sesions in its midportions  . CORONARY ANGIOPLASTY WITH STENT PLACEMENT  06/24/2017  . CORONARY ARTERY BYPASS GRAFT  04/19/2008   x2 -- EF of 70% -- left internal mammary artery to the LAD and a saphenous vein graft to the first diagonal with apparent intermediate coronary that was felt to be too small to bypass  . CORONARY STENT INTERVENTION N/A 06/24/2017   Procedure: CORONARY STENT INTERVENTION;  Surgeon: Martinique, Peter M, MD;  Location: [facility];  Service: Cardiovascular;  Laterality: N/A;  . JOINT REPLACEMENT    . LEFT HEART CATH AND CORS/GRAFTS ANGIOGRAPHY N/A 06/24/2017   Procedure: LEFT HEART CATH AND CORS/GRAFTS ANGIOGRAPHY;  Surgeon:  Swazil[place], Peter M, MD;  Location: [facility];  Service: Cardiovascular;  Laterality: N/A;  . REVISION TOTAL HIP ARTHROPLASTY Left 01/2014   "@ [address]"  . TOTAL HIP ARTHROPLASTY Left 12/2005     Medications: No outpatient medications have been marked as taking for the 08/24/19 encounter (Appointment) with Rosalio Macadamia, NP.     Allergies: Allergies  Allergen Reactions  . Zocor [Simvastatin] Other (See Comments)    myalgias    Social History: The patient  reports that he has never smoked. He has never used smokeless tobacco. He  reports that he does not drink alcohol or use drugs.   Family History: The patient's ***family history includes Edema in his mother.   Review of Systems: Please see the history of present illness.   All other systems are reviewed and negative.   Physical Exam: VS:  There were no vitals taken for this visit. Marland Kitchen  BMI There is no height or weight on file to calculate BMI.  Wt Readings from Last 3 Encounters:  05/18/18 191 lb 12.8 oz (87 kg)  04/12/18 188 lb (85.3 kg)  03/03/18 189 lb 12.8 oz (86.1 kg)    General: Pleasant. Well developed, well nourished and in no acute distress.   HEENT: Normal.  Neck: Supple, no JVD, carotid bruits, or masses noted.  Cardiac: ***Regular rate and rhythm. No murmurs, rubs, or gallops. No edema.  Respiratory:  Lungs are clear to auscultation bilaterally with normal work of breathing.  GI: Soft and nontender.  MS: No deformity or atrophy. Gait and ROM intact.  Skin: Warm and dry. Color is normal.  Neuro:  Strength and sensation are intact and no gross focal deficits noted.  Psych: Alert, appropriate and with normal affect.   LABORATORY DATA:  EKG:  EKG {ACTION; IS/IS TOI:71245809} ordered today.  Personally reviewed by me. This demonstrates ***.  Lab Results  Component Value Date   WBC 6.6 05/18/2018   HGB 13.8 05/18/2018   HCT 40.4 05/18/2018   PLT 273 05/18/2018   GLUCOSE 150 (H) 05/18/2018   CHOL 162 03/03/2018   TRIG 258 (H) 03/03/2018   HDL 51 03/03/2018   LDLDIRECT 95.1 12/26/2010   LDLCALC 59 03/03/2018   ALT 14 03/03/2018   AST 14 03/03/2018   NA 137 05/18/2018   K 3.9 05/18/2018   CL 101 05/18/2018   CREATININE 0.79 05/18/2018   BUN 16 05/18/2018   CO2 23 05/18/2018   TSH 1.690 04/12/2018   INR 1.1 06/22/2017     BNP (last 3 results) No results for input(s): BNP in the last 8760 hours.  ProBNP (last 3 results) No results for input(s): PROBNP in the last 8760 hours.   Other Studies Reviewed Today:  Result Notes for  CARDIAC EVENT MONITOR   Notes recorded by Nahser, Deloris Ping, MD on 05/17/2018 at 5:20 PM EST Sinus rhythm with occasional episodes of atrial flutter and atrial fib  The atrial fib is known He is seeing Lawson Fiscal tomorrow    CORONARY STENT INTERVENTION1/2019  LEFT HEART CATH AND CORS/GRAFTS ANGIOGRAPHY  Conclusion     Prox RCA lesion is 95% stenosed.  Ost LAD to Prox LAD lesion is 100% stenosed.  Ost Ramus to Ramus lesion is 90% stenosed.  A drug-eluting stent was successfully placed using a STENT SYNERGY DES 2.5X20.  Post intervention, there is a 0% residual stenosis.  Prox Cx lesion is 95% stenosed.  Post intervention, there is a 0% residual stenosis.  A drug-eluting  stent was successfully placed using a STENT SYNERGY DES 3.5X20.  SVG graft was visualized by angiography and is normal in caliber.  The graft exhibits no disease.  LIMA graft was visualized by angiography and is normal in caliber.  The graft exhibits no disease.  The left ventricular systolic function is normal.  LV end diastolic pressure is normal.  The left ventricular ejection fraction is 55-65% by visual estimate.  1. Severe 3 vessel obstructive CAD 2. Patent LIMA to the LAD 3. Patent SVG to the first diagonal 4. Normal LV function 5. Normal LVEDP 6. Successful PCI of the mid LCx- dominant vessel with DES 7. Successful PCI of the proximal ramus intermediate with DES  Plan: DAPT for one year. Anticipate DC in am. The nondominant RCA is too small for PCI and should be managed medically.    MyoviewStudy Highlights12/2018    Nuclear stress EF: 65%. There is septal wall akinesis (post CABG septal wall changes).  There was no ST segment deviation noted during stress.  Defect 1: There is a large defect of moderate severity present in the basal inferior, mid inferior and apical inferior location.  Findings consistent with prior myocardial infarction with peri-infarct ischemia.   This is an intermediate risk study.  Jared Schultz, MD     ASSESSMENT & PLAN:   1.PAF/flutter - on Xarelto - now with CHADSVASC of 3. He does not seem to be having any issue.   2. CAD - remote CABG in 2009 with cath and 2 vessel PCI to the mid LCX and proximal ramus intermediate in January of 2019. He has known residual disease - no active or worrisome symptoms noted. Remains on Imdur. The disease in the RCA is too small for intervention.   3. Chronic anticoagulation - no problems noted - he does need surveillance labs and will do on return visit.   4. HTN - BP is ok. No changes made.  5. HLD - on stsatin - labs from March noted.   6. History of resting bradycardia -with chronicborderline 1st degree AV block.Has also had PAF/flutter. Will need EKG on return. He has no worrisome symptoms.   7. Chronic pain syndrome-followed by the pain clinic.Lots of back pain.Not discussed today.  8. Probable OSA - he has not wished to have his sleep study.   Marland Kitchen COVID-19 Education: The signs and symptoms of COVID-19 were discussed with the patient and how to seek care for testing (follow up with PCP or arrange E-visit).  The importance of social distancing, staying at home, hand hygiene and wearing a mask when out in public were discussed today.  Current medicines are reviewed with the patient today.  The patient does not have concerns regarding medicines other than what has been noted above.  The following changes have been made:  See above.  Labs/ tests ordered today include:   No orders of the defined types were placed in this encounter.    Disposition:   FU with *** in {gen number 9-48:546270} {Days to years:10300}.   Patient is agreeable to this plan and will call if any problems develop in the interim.   SignedNorma Fredrickson, NP  08/23/2019 7:25 AM  [facility] [address] [place], [place]  [address] Phone: [phone] Fax: [phone]

## 2019-08-17 NOTE — Telephone Encounter (Signed)
S/w pt is ok to move appt due to providers schedule.

## 2019-08-22 ENCOUNTER — Ambulatory Visit: Payer: 59 | Admitting: Nurse Practitioner

## 2019-08-24 ENCOUNTER — Ambulatory Visit: Payer: 59 | Admitting: Nurse Practitioner

## 2019-08-24 ENCOUNTER — Telehealth: Payer: Self-pay | Admitting: *Deleted

## 2019-08-24 NOTE — Telephone Encounter (Signed)
  Patient Consent for Virtual Visit         Jared [name] has provided verbal consent on 08/24/2019 for a virtual visit (video or telephone).   CONSENT FOR VIRTUAL VISIT FOR:  Jared [name]  By participating in this virtual visit I agree to the following:  I hereby voluntarily request, consent and authorize [facility] and its employed or contracted physicians, physician assistants, nurse practitioners or other licensed health care professionals (the Practitioner), to provide me with telemedicine health care services (the "Services") as deemed necessary by the treating Practitioner. I acknowledge and consent to receive the Services by the Practitioner via telemedicine. I understand that the telemedicine visit will involve communicating with the Practitioner through live audiovisual communication technology and the disclosure of certain medical information by electronic transmission. I acknowledge that I have been given the opportunity to request an in-person assessment or other available alternative prior to the telemedicine visit and am voluntarily participating in the telemedicine visit.  I understand that I have the right to withhold or withdraw my consent to the use of telemedicine in the course of my care at any time, without affecting my right to future care or treatment, and that the Practitioner or I may terminate the telemedicine visit at any time. I understand that I have the right to inspect all information obtained and/or recorded in the course of the telemedicine visit and may receive copies of available information for a reasonable fee.  I understand that some of the potential risks of receiving the Services via telemedicine include:  Marland Kitchen Delay or interruption in medical evaluation due to technological equipment failure or disruption; . Information transmitted may not be sufficient (e.g. poor resolution of images) to allow for appropriate medical decision making by the Practitioner; and/or    . In rare instances, security protocols could fail, causing a breach of personal health information.  Furthermore, I acknowledge that it is my responsibility to provide information about my medical history, conditions and care that is complete and accurate to the best of my ability. I acknowledge that Practitioner's advice, recommendations, and/or decision may be based on factors not within their control, such as incomplete or inaccurate data provided by me or distortions of diagnostic images or specimens that may result from electronic transmissions. I understand that the practice of medicine is not an exact science and that Practitioner makes no warranties or guarantees regarding treatment outcomes. I acknowledge that a copy of this consent can be made available to me via my patient portal [facility] MyChart), or I can request a printed copy by calling the office of [facility].    I understand that my insurance will be billed for this visit.   I have read or had this consent read to me. . I understand the contents of this consent, which adequately explains the benefits and risks of the Services being provided via telemedicine.  . I have been provided ample opportunity to ask questions regarding this consent and the Services and have had my questions answered to my satisfaction. . I give my informed consent for the services to be provided through the use of telemedicine in my medical care

## 2019-08-31 NOTE — Progress Notes (Signed)
Telehealth Visit     Virtual Visit via Video Note   This visit type was conducted due to national recommendations for restrictions regarding the COVID-19 Pandemic (e.g. social distancing) in an effort to limit this patient's exposure and mitigate transmission in our community.  Due to his co-morbid illnesses, this patient is at least at moderate risk for complications without adequate follow up.  This format is felt to be most appropriate for this patient at this time.  All issues noted in this document were discussed and addressed.  A limited physical exam was performed with this format.  Please refer to the patient's chart for his consent to telehealth for [facility].   Evaluation Performed:  Follow-up visit  This visit type was conducted due to national recommendations for restrictions regarding the COVID-19 Pandemic (e.g. social distancing).  This format is felt to be most appropriate for this patient at this time.  All issues noted in this document were discussed and addressed.  No physical exam was performed (except for noted visual exam findings with Video Visits).  Please refer to the patient's chart (MyChart message for video visits and phone note for telephone visits) for the patient's consent to telehealth for [facility].  Date:  09/05/2019   ID:  Jared [name], DOB 08-15-1955, MRN [id number]  Patient Location:  Work  Provider location:   Home  PCP:  Tisovec, Adelfa Koh, MD  Cardiologist:  Tyrone Sage & No primary care provider on file.  Electrophysiologist:  None   Chief Complaint:  Follow Up  History of Present Illness:    Jared [name] is a 64 y.o. male who presents via audio/video conferencing for a telehealth visit today.  Seen for Dr. Elease Hashimoto. Former patient of Dr. Ronnald Nian.Primarily follows with me.  He has known CAD with prior CABG in 2009 with LIMA to LAD and SVG to DX. Intermediate vessel too small to bypass. His surgery was in [place].Other issues include ED,  HTN and HLD.No prior history of MI noted.  SeeninNovemberof 2018- some chest pain reported - somewhat similar to prior chest pain syndrome - referred for Myoview - this was abnormal.He was working out of town and was not able to come back for follow up untilJanuaryof 2019- then referred for cath -ended up having2 vessel PCI.On DAPT.Did well following his PCI but last seen in October - some chest pain and heart "up and down". Soundedsomewhatsimilar to his prior chest pain syndrome. I added Imdurto his regimen- which did help. He then started having palpitations - placed an event monitor which showed AF/flutter - paroxsymal. Plavix was stopped and he was started on Xarelto.   Last seen by me here in the office in December of 2019 - he was doing well. On aspirin for CAD/prior stent along with his Xarelto for AF. He was referred for sleep study but did not wish to follow thru. Cardiac status ok.We did a telehealth visit in May and again in November - he as been doing ok. Has changed jobs.   The patient does not have symptoms concerning for COVID-19 infection (fever, chills, cough, or new shortness of breath).   Seen today by telephone visit - Caregility video did not work. He has consented for this visit. Doing fine. Not able to check BP. He is at work. He is considering retirement but may work another year. Feeling good. No chest pain. Not short of breath. No palpitations. No bleeding. Feels ok on his medicines. Having his physical later this month  with repeat labs. He is going to sale a home in [place] that he invested in.    Past Medical History:  Diagnosis Date  . Chronic back pain   . Coronary artery disease    a. CABGx2 Left internal mammary artery to the LAD and a saphenous vein graft to the first diagonal with apparent intermediate coronary that was felt to be too small to bypass. b. Abnl stress test 05/2017 s/p DES to mid LCx, DES to ramus intermediate, nondominant RCA too  small for PCI (med rx), normal EF.  Marland Kitchen Depression   . ED (erectile dysfunction)   . Fatigue   . Hyperlipidemia   . Hypertension   . Osteoarthritis    "qwhere" (06/24/2017)  . Sinus bradycardia    Past Surgical History:  Procedure Laterality Date  . CARDIAC CATHETERIZATION  04/18/2008   severe stenosis in the left anterior descending and what was described at that point as the first and second diagonal vessels.  The right coronary artery was nondominant .  The left circumfles had what was felt to be moderate sesions in its midportions  . CORONARY ANGIOPLASTY WITH STENT PLACEMENT  06/24/2017  . CORONARY ARTERY BYPASS GRAFT  04/19/2008   x2 -- EF of 70% -- left internal mammary artery to the LAD and a saphenous vein graft to the first diagonal with apparent intermediate coronary that was felt to be too small to bypass  . CORONARY STENT INTERVENTION N/A 06/24/2017   Procedure: CORONARY STENT INTERVENTION;  Surgeon: Swaziland, Peter M, MD;  Location[facility] INVASIVE CV LAB;  Service: Cardiovascular;  Laterality: N/A;  . JOINT REPLACEMENT    . LEFT HEART CATH AND CORS/GRAFTS ANGIOGRAPHY N/A 06/24/2017   Procedure: LEFT HEART CATH AND CORS/GRAFTS ANGIOGRAPHY;  Surgeon: Swaziland, Peter M, MD;  Location: [facility] INVASIVE CV LAB;  Service: Cardiovascular;  Laterality: N/A;  . REVISION TOTAL HIP ARTHROPLASTY Left 01/2014   "@ [address]"  . TOTAL HIP ARTHROPLASTY Left 12/2005     Current Meds  Medication Sig  . acetaminophen (TYLENOL) 500 MG tablet Take 1,000 mg by mouth every 6 (six) hours as needed for mild pain (back).  Marland Kitchen aspirin EC 81 MG tablet Take 1 tablet (81 mg total) by mouth daily.  Marland Kitchen gabapentin (NEURONTIN) 600 MG tablet Take 600 mg by mouth 3 (three) times daily.   . isosorbide mononitrate (IMDUR) 30 MG 24 hr tablet TAKE 1 TABLET(30 MG) BY MOUTH DAILY  . metoprolol tartrate (LOPRESSOR) 25 MG tablet TAKE 1 TABLET(25 MG) BY MOUTH TWICE DAILY  . nitroGLYCERIN (NITROSTAT) 0.4 MG SL tablet PLACE 1 TABLET  UNDER THE TONGUE EVERY 5 MINUTES AS NEEDED FOR CHEST PAIN  . rosuvastatin (CRESTOR) 5 MG tablet Take 1 tablet (5 mg total) by mouth at bedtime.  . sertraline (ZOLOFT) 100 MG tablet Take 100 mg by mouth daily.   Marland Kitchen tiZANidine (ZANAFLEX) 4 MG tablet Take 4 mg by mouth at bedtime.   Marland Kitchen VASCEPA 1 g CAPS Take 1 g by mouth 2 (two) times daily.   Carlena Hurl 20 MG TABS tablet TAKE 1 TABLET BY MOUTH EVERY EVENING WITH FOOD     Allergies:   Zocor [simvastatin]   Social History   Tobacco Use  . Smoking status: Never Smoker  . Smokeless tobacco: Never Used  Substance Use Topics  . Alcohol use: No  . Drug use: No     Family Hx: The patient's family history includes Edema in his mother.  ROS:   Please see  the history of present illness.   All other systems reviewed are negative.    Objective:    Vital Signs:  There were no vitals taken for this visit.   Wt Readings from Last 3 Encounters:  05/18/18 191 lb 12.8 oz (87 kg)  04/12/18 188 lb (85.3 kg)  03/03/18 189 lb 12.8 oz (86.1 kg)    Alert male in no acute distress. Sounds good with conversation.    Labs/Other Tests and Data Reviewed:    Lab Results  Component Value Date   WBC 6.6 05/18/2018   HGB 13.8 05/18/2018   HCT 40.4 05/18/2018   PLT 273 05/18/2018   GLUCOSE 150 (H) 05/18/2018   CHOL 162 03/03/2018   TRIG 258 (H) 03/03/2018   HDL 51 03/03/2018   LDLDIRECT 95.1 12/26/2010   LDLCALC 59 03/03/2018   ALT 14 03/03/2018   AST 14 03/03/2018   NA 137 05/18/2018   K 3.9 05/18/2018   CL 101 05/18/2018   CREATININE 0.79 05/18/2018   BUN 16 05/18/2018   CO2 23 05/18/2018   TSH 1.690 04/12/2018   INR 1.1 06/22/2017       BNP (last 3 results) No results for input(s): BNP in the last 8760 hours.  ProBNP (last 3 results) No results for input(s): PROBNP in the last 8760 hours.    Prior CV studies:    The following studies were reviewed today:   Result Notes for CARDIAC EVENT MONITOR   Notes recorded by  Nahser, Deloris Ping, MD on 05/17/2018 at 5:20 PM EST Sinus rhythm with occasional episodes of atrial flutter and atrial fib  The atrial fib is known He is seeing Lawson Fiscal tomorrow    CORONARY STENT INTERVENTION1/2019  LEFT HEART CATH AND CORS/GRAFTS ANGIOGRAPHY  Conclusion     Prox RCA lesion is 95% stenosed.  Ost LAD to Prox LAD lesion is 100% stenosed.  Ost Ramus to Ramus lesion is 90% stenosed.  A drug-eluting stent was successfully placed using a STENT SYNERGY DES 2.5X20.  Post intervention, there is a 0% residual stenosis.  Prox Cx lesion is 95% stenosed.  Post intervention, there is a 0% residual stenosis.  A drug-eluting stent was successfully placed using a STENT SYNERGY DES 3.5X20.  SVG graft was visualized by angiography and is normal in caliber.  The graft exhibits no disease.  LIMA graft was visualized by angiography and is normal in caliber.  The graft exhibits no disease.  The left ventricular systolic function is normal.  LV end diastolic pressure is normal.  The left ventricular ejection fraction is 55-65% by visual estimate.  1. Severe 3 vessel obstructive CAD 2. Patent LIMA to the LAD 3. Patent SVG to the first diagonal 4. Normal LV function 5. Normal LVEDP 6. Successful PCI of the mid LCx- dominant vessel with DES 7. Successful PCI of the proximal ramus intermediate with DES  Plan: DAPT for one year. Anticipate DC in am. The nondominant RCA is too small for PCI and should be managed medically.    MyoviewStudy Highlights12/2018    Nuclear stress EF: 65%. There is septal wall akinesis (post CABG septal wall changes).  There was no ST segment deviation noted during stress.  Defect 1: There is a large defect of moderate severity present in the basal inferior, mid inferior and apical inferior location.  Findings consistent with prior myocardial infarction with peri-infarct ischemia.  This is an intermediate risk study.  Donato Schultz, MD     ASSESSMENT & PLAN:  1. CAD - remote CABG in 2009 with cath and 2 vessel PCI to [facility] and proximal RI from January of 2019 - known residual disease - doing well with no worrisome symptoms. Disease in the RCA is too small for intervention - needs risk factor modification.    2. PAF/flutter - on Xarelto - has CHADSVASC of 3 - no palpitations noted.   3. Chronic anticoagulation - does not sound like he is having any issues.   4. HTN - not able to check BP.   5. HLD - on statin - lab from November noted. LDL was 100 - he has upcoming labs later this month.   6. 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 Hope to see in person for next visit.   7. Chronic back pain - not discussed.   8. Probable OSA - has not wished to have sleep study  9. COVID-19 Education: The signs and symptoms of COVID-19 were discussed with the patient and how to seek care for testing (follow up with PCP or arrange E-visit).  The importance of social distancing, staying at home, hand hygiene and wearing a mask when out in public were discussed today.  Patient Risk:   After full review of this patient's clinical status, I feel that they are at least moderate risk at this time.  Time:   Today, I have spent 7 minutes with the patient with telehealth technology discussing the above issues.     Medication Adjustments/Labs and Tests Ordered: Current medicines are reviewed at length with the patient today.  Concerns regarding medicines are outlined above.   Tests Ordered: No orders of the defined types were placed in this encounter.   Medication Changes: No orders of the defined types were placed in this encounter.   Disposition:  FU with me in 4 months with EKG.   Patient is agreeable to this plan and will call if any problems develop in the interim.   Amie Critchley, NP  09/05/2019 9:16 AM    [place]

## 2019-09-05 ENCOUNTER — Telehealth (INDEPENDENT_AMBULATORY_CARE_PROVIDER_SITE_OTHER): Payer: 59 | Admitting: Nurse Practitioner

## 2019-09-05 ENCOUNTER — Encounter: Payer: Self-pay | Admitting: Nurse Practitioner

## 2019-09-05 ENCOUNTER — Other Ambulatory Visit: Payer: Self-pay

## 2019-09-05 DIAGNOSIS — I480 Paroxysmal atrial fibrillation: Secondary | ICD-10-CM

## 2019-09-05 DIAGNOSIS — Z7189 Other specified counseling: Secondary | ICD-10-CM

## 2019-09-05 DIAGNOSIS — I259 Chronic ischemic heart disease, unspecified: Secondary | ICD-10-CM

## 2019-09-05 DIAGNOSIS — I10 Essential (primary) hypertension: Secondary | ICD-10-CM

## 2019-09-05 DIAGNOSIS — E7849 Other hyperlipidemia: Secondary | ICD-10-CM

## 2019-09-05 NOTE — Patient Instructions (Addendum)
After Visit Summary:  We will be checking the following labs today - NONE   Medication Instructions:    Continue with your current medicines.    If you need a refill on your cardiac medications before your next appointment, please call your pharmacy.     Testing/Procedures To Be Arranged:  N/A  Follow-Up:   See me in 4 months with EKG    At [facility], you and your health needs are our priority.  As part of our continuing mission to provide you with exceptional heart care, we have created designated Provider Care Teams.  These Care Teams include your primary Cardiologist (physician) and Advanced Practice Providers (APPs -  Physician Assistants and Nurse Practitioners) who all work together to provide you with the care you need, when you need it.  Special Instructions:  . Stay safe, stay home, wash your hands for at least 20 seconds and wear a mask when out in public.  . It was good to talk with you today. Peri Jefferson luck with all your decisions!    Call the [facility] office at [phone] if you have any questions, problems or concerns.

## 2019-09-13 ENCOUNTER — Other Ambulatory Visit: Payer: Self-pay | Admitting: Nurse Practitioner

## 2019-09-19 ENCOUNTER — Telehealth: Payer: Self-pay | Admitting: Nurse Practitioner

## 2019-09-19 NOTE — Telephone Encounter (Signed)
Jared [name] is calling requesting a nurse give him a call in regards to getting him a sooner appointment with [name] due to him being concerned with his oxygen levels. An appointment has been scheduled in regards to this for her first available on 10/17/19. Please advise.

## 2019-09-19 NOTE — Telephone Encounter (Signed)
S/w pt to make sooner appointment.

## 2019-09-19 NOTE — Telephone Encounter (Signed)
Pt states that he is concerned regarding his oxygen levels. He has checked it using a finger probe several times and his readings range from 93-95%. Pt reports feeling like he can not get a full breath when inhaling from time to time. He denies feeling SOB, CP, swelling or diaphoresis.  I assured the patient that these readings were normal and not concerning at all.  The patient reports that he has been recently laid off and does admit that he is anxious from time to time. Pt agrees that his anxiety may be contributing to him feeling this way.   Patient would like to see LG as soon as possible. I let him know that he has been scheduled for the earliest in office on 5/24. He understands to call back with any new concerns.

## 2019-09-25 NOTE — Progress Notes (Signed)
CARDIOLOGY OFFICE NOTE  Date:  09/26/2019    Rodena Goldmann [place] Date of Birth: Apr 01, 1956 Medical Record #[id number]  PCP:  Gaspar Garbe, MD  Cardiologist:  Tyrone Sage & Nahser  Chief Complaint  Patient presents with  . Follow-up    Work in visit to discuss pulse ox readings    History of Present Illness: Jared [name] is a 64 y.o. male who presents today for a work in visit. Seen for Dr. Elease Hashimoto. Former patient of Dr. Ronnald Nian.Primarily follows with me.  He has known CAD with prior CABG in 2009 with LIMA to LAD and SVG to DX. Intermediate vessel too small to bypass. His surgery was in [place].Other issues include ED, HTN and HLD.No prior history of MI noted.  SeeninNovemberof 2018- some chest pain reported - somewhat similar to prior chest pain syndrome - referred for Myoview - this was abnormal.He was working out of town and was not able to come back for follow up untilJanuaryof 2019- then referred for cath -ended up having2 vessel PCI.On DAPT.Did well following his PCI but last seen in October - some chest pain and heart "up and down". Soundedsomewhatsimilar to his prior chest pain syndrome. I added Imdurto his regimen- which did help. He then started having palpitations - placed an event monitor which showed AF/flutter - paroxsymal. Plavix was stopped and he was started on Xarelto.   Last seen by [facility] in the office in December of 2019 -he was doing well. On aspirin for CAD/prior stentalong with his Xareltofor AF.He was referred for sleep study but did not wish to follow thru. Cardiac status ok.We have done several telehealth visits since - he has done ok - last just a few weeks ago.   Called last week with concern for a low pulse ox but noted readings in the 93 to 95% range. Wanted an in office visit. Thus added to my schedule for today.   The patient does not have symptoms concerning for COVID-19 infection (fever, chills, cough, or new shortness of  breath).   Comes in today. Here alone. He notes that after our phone visit that last week he felt like he could not get a good breath - checked his sats - 93% - he thought this was low. No chest pain. Feels fine now. He has not had any palpitations. He has missed some doses of Xarelto over the past 4 weeks - maybe 3 times. No chest pain. He is going to be off from work for the next 4 weeks - then decide about retirement.   Past Medical History:  Diagnosis Date  . Chronic back pain   . Coronary artery disease    a. CABGx2 Left internal mammary artery to the LAD and a saphenous vein graft to the first diagonal with apparent intermediate coronary that was felt to be too small to bypass. b. Abnl stress test 05/2017 s/p DES to mid LCx, DES to ramus intermediate, nondominant RCA too small for PCI (med rx), normal EF.  Marland Kitchen Depression   . ED (erectile dysfunction)   . Fatigue   . Hyperlipidemia   . Hypertension   . Osteoarthritis    "qwhere" (06/24/2017)  . Sinus bradycardia     Past Surgical History:  Procedure Laterality Date  . CARDIAC CATHETERIZATION  04/18/2008   severe stenosis in the left anterior descending and what was described at that point as the first and second diagonal vessels.  The right coronary artery was nondominant .  The left circumfles had what was felt to be moderate sesions in its midportions  . CORONARY ANGIOPLASTY WITH STENT PLACEMENT  06/24/2017  . CORONARY ARTERY BYPASS GRAFT  04/19/2008   x2 -- EF of 70% -- left internal mammary artery to the LAD and a saphenous vein graft to the first diagonal with apparent intermediate coronary that was felt to be too small to bypass  . CORONARY STENT INTERVENTION N/A 06/24/2017   Procedure: CORONARY STENT INTERVENTION;  Surgeon: Martinique, Peter M, MD;  Location: [place] [facility];  Service: Cardiovascular;  Laterality: N/A;  . JOINT REPLACEMENT    . LEFT HEART CATH AND CORS/GRAFTS ANGIOGRAPHY N/A 06/24/2017   Procedure: LEFT HEART CATH  AND CORS/GRAFTS ANGIOGRAPHY;  Surgeon: Martinique, Peter M, MD;  Location: [place] [facility];  Service: Cardiovascular;  Laterality: N/A;  . REVISION TOTAL HIP ARTHROPLASTY Left 01/2014   "@ [facility]"  . TOTAL HIP ARTHROPLASTY Left 12/2005     Medications: Current Meds  Medication Sig  . acetaminophen (TYLENOL) 500 MG tablet Take 1,000 mg by mouth every 6 (six) hours as needed for mild pain (back).  Marland Kitchen aspirin EC 81 MG tablet Take 1 tablet (81 mg total) by mouth daily.  Marland Kitchen gabapentin (NEURONTIN) 600 MG tablet Take 600 mg by mouth 3 (three) times daily.   . isosorbide mononitrate (IMDUR) 30 MG 24 hr tablet TAKE 1 TABLET(30 MG) BY MOUTH DAILY  . metoprolol tartrate (LOPRESSOR) 25 MG tablet TAKE 1 TABLET(25 MG) BY MOUTH TWICE DAILY  . nitroGLYCERIN (NITROSTAT) 0.4 MG SL tablet TAKE 1 TABLET UNDER THE TONGUE EVERY 5 MINUTES AS NEEDED FOR CHEST PAIN  . rosuvastatin (CRESTOR) 5 MG tablet Take 1 tablet (5 mg total) by mouth at bedtime.  . sertraline (ZOLOFT) 100 MG tablet Take 100 mg by mouth daily.   Marland Kitchen tiZANidine (ZANAFLEX) 4 MG tablet Take 4 mg by mouth at bedtime.   Marland Kitchen VASCEPA 1 g CAPS Take 1 g by mouth 2 (two) times daily.   Alveda Reasons 20 MG TABS tablet TAKE 1 TABLET BY MOUTH EVERY EVENING WITH FOOD     Allergies: Allergies  Allergen Reactions  . Zocor [Simvastatin] Other (See Comments)    myalgias    Social History: The patient  reports that he has never smoked. He has never used smokeless tobacco. He reports that he does not drink alcohol or use drugs.   Family History: The patient's family history includes Edema in his mother.   Review of Systems: Please see the history of present illness.   All other systems are reviewed and negative.   Physical Exam: VS:  BP 120/80   Pulse 72   Ht 5\' 8"  (1.727 m)   Wt 194 lb (88 kg)   SpO2 94%   BMI 29.50 kg/m  .  BMI Body mass index is 29.5 kg/m.  Wt Readings from Last 3 Encounters:  09/26/19 194 lb (88 kg)  05/18/18 191 lb 12.8 oz (87  kg)  04/12/18 188 lb (85.3 kg)    General: Pleasant. Alert and in no acute distress.   HEENT: Normal.  Neck: Supple, no JVD, carotid bruits, or masses noted.  Cardiac: Irregular rhythm - rate is fine.  No edema.  Respiratory:  Lungs are clear to auscultation bilaterally with normal work of breathing.  GI: Soft and nontender.  MS: No deformity or atrophy. Gait and ROM intact.  Skin: Warm and dry. Color is normal.  Neuro:  Strength and sensation are intact and  no gross focal deficits noted.  Psych: Alert, appropriate and with normal affect.   LABORATORY DATA:  EKG:  EKG is ordered today.  Personally reviewed by me. This demonstrates recurrent AF - his rate is controlled.  Lab Results  Component Value Date   WBC 6.6 05/18/2018   HGB 13.8 05/18/2018   HCT 40.4 05/18/2018   PLT 273 05/18/2018   GLUCOSE 150 (H) 05/18/2018   CHOL 162 03/03/2018   TRIG 258 (H) 03/03/2018   HDL 51 03/03/2018   LDLDIRECT 95.1 12/26/2010   LDLCALC 59 03/03/2018   ALT 14 03/03/2018   AST 14 03/03/2018   NA 137 05/18/2018   K 3.9 05/18/2018   CL 101 05/18/2018   CREATININE 0.79 05/18/2018   BUN 16 05/18/2018   CO2 23 05/18/2018   TSH 1.690 04/12/2018   INR 1.1 06/22/2017         BNP (last 3 results) No results for input(s): BNP in the last 8760 hours.  ProBNP (last 3 results) No results for input(s): PROBNP in the last 8760 hours.   Other Studies Reviewed Today:  Result Notes for CARDIAC EVENT MONITOR   Notes recorded by Nahser, Deloris Ping, MD on 05/17/2018 at 5:20 PM EST Sinus rhythm with occasional episodes of atrial flutter and atrial fib  The atrial fib is known He is seeing Lawson Fiscal tomorrow    CORONARY STENT INTERVENTION1/2019  LEFT HEART CATH AND CORS/GRAFTS ANGIOGRAPHY  Conclusion     Prox RCA lesion is 95% stenosed.  Ost LAD to Prox LAD lesion is 100% stenosed.  Ost Ramus to Ramus lesion is 90% stenosed.  A drug-eluting stent was successfully placed using a  STENT SYNERGY DES 2.5X20.  Post intervention, there is a 0% residual stenosis.  Prox Cx lesion is 95% stenosed.  Post intervention, there is a 0% residual stenosis.  A drug-eluting stent was successfully placed using a STENT SYNERGY DES 3.5X20.  SVG graft was visualized by angiography and is normal in caliber.  The graft exhibits no disease.  LIMA graft was visualized by angiography and is normal in caliber.  The graft exhibits no disease.  The left ventricular systolic function is normal.  LV end diastolic pressure is normal.  The left ventricular ejection fraction is 55-65% by visual estimate.  1. Severe 3 vessel obstructive CAD 2. Patent LIMA to the LAD 3. Patent SVG to the first diagonal 4. Normal LV function 5. Normal LVEDP 6. Successful PCI of the mid LCx- dominant vessel with DES 7. Successful PCI of the proximal ramus intermediate with DES  Plan: DAPT for one year. Anticipate DC in am. The nondominant RCA is too small for PCI and should be managed medically.    MyoviewStudy Highlights12/2018    Nuclear stress EF: 65%. There is septal wall akinesis (post CABG septal wall changes).  There was no ST segment deviation noted during stress.  Defect 1: There is a large defect of moderate severity present in the basal inferior, mid inferior and apical inferior location.  Findings consistent with prior myocardial infarction with peri-infarct ischemia.  This is an intermediate risk study.  Donato Schultz, MD     ASSESSMENT & PLAN:    1. ?shortness of breath- his sats are fine - he is back in AF - I suspect this is the cause  2. Recurrent AF - has missed some doses of Xarelto - this was previously felt to be PAF - will see back in a month and recheck EKG - consider  one time cardioversion. he now feels ok.   3. CAD - remote CABG from 2009 with cath and 2 vessel PCI to [facility] and promixal RI from January of 2019 - has known residual disease as well -  managed medically. Disease in the RCA is too small for intervention.   4. Chronic anticoagulation - he has some missed doses of his Xarelto - needs 4 weeks without missed doses before we consider cardioversion.   5. HTN - BP is ok here today.   6. HLD - on statin - labs from last week noted and look good.   7. Probable OSA - discussed again - probably reason for his AF - he says we will discuss on return.   8. COVID-19 Education: The signs and symptoms of COVID-19 were discussed with the patient and how to seek care for testing (follow up with PCP or arrange E-visit).  The importance of social distancing, staying at home, hand hygiene and wearing a mask when out in public were discussed today.  Current medicines are reviewed with the patient today.  The patient does not have concerns regarding medicines other than what has been noted above.  The following changes have been made:  See above.  Labs/ tests ordered today include:    Orders Placed This Encounter  Procedures  . EKG 12-Lead     Disposition:   FU with me in 4 weeks with EKG - consider cardioversion if still in AF. Continue to recommend sleep study. His labs are up to date.   Patient is agreeable to this plan and will call if any problems develop in the interim.   SignedNorma Fredrickson, NP  09/26/2019 11:53 AM  [facility] [address] [place], [place]  [address] Phone: [phone] Fax: [phone]

## 2019-09-26 ENCOUNTER — Ambulatory Visit: Payer: 59 | Admitting: Nurse Practitioner

## 2019-09-26 ENCOUNTER — Other Ambulatory Visit: Payer: Self-pay

## 2019-09-26 ENCOUNTER — Encounter: Payer: Self-pay | Admitting: Nurse Practitioner

## 2019-09-26 VITALS — BP 120/80 | HR 72 | Ht 68.0 in | Wt 194.0 lb

## 2019-09-26 DIAGNOSIS — I480 Paroxysmal atrial fibrillation: Secondary | ICD-10-CM | POA: Diagnosis not present

## 2019-09-26 DIAGNOSIS — R0683 Snoring: Secondary | ICD-10-CM

## 2019-09-26 DIAGNOSIS — R400 Somnolence: Secondary | ICD-10-CM

## 2019-09-26 DIAGNOSIS — I10 Essential (primary) hypertension: Secondary | ICD-10-CM

## 2019-09-26 DIAGNOSIS — E7849 Other hyperlipidemia: Secondary | ICD-10-CM | POA: Diagnosis not present

## 2019-09-26 DIAGNOSIS — I259 Chronic ischemic heart disease, unspecified: Secondary | ICD-10-CM

## 2019-09-26 DIAGNOSIS — Z79899 Other long term (current) drug therapy: Secondary | ICD-10-CM

## 2019-09-26 DIAGNOSIS — Z955 Presence of coronary angioplasty implant and graft: Secondary | ICD-10-CM

## 2019-09-26 NOTE — Patient Instructions (Addendum)
After Visit Summary:  We will be checking the following labs today - NONE   Medication Instructions:    Continue with your current medicines.    If you need a refill on your cardiac medications before your next appointment, please call your pharmacy.     Testing/Procedures To Be Arranged:  N/A  Follow-Up:   See me in one month with EKG - in person    At [facility], you and your health needs are our priority.  As part of our continuing mission to provide you with exceptional heart care, we have created designated Provider Care Teams.  These Care Teams include your primary Cardiologist (physician) and Advanced Practice Providers (APPs -  Physician Assistants and Nurse Practitioners) who all work together to provide you with the care you need, when you need it.  Special Instructions:  . Stay safe, stay home, wash your hands for at least 20 seconds and wear a mask when out in public.  . It was good to talk with you today.    Call the [facility] office at [phone] if you have any questions, problems or concerns.

## 2019-10-17 ENCOUNTER — Ambulatory Visit: Payer: 59 | Admitting: Nurse Practitioner

## 2019-10-26 NOTE — Progress Notes (Deleted)
CARDIOLOGY OFFICE NOTE  Date:  10/26/2019    Jared [name] Date of Birth: 1955-11-21 Medical Record #[id number]  PCP:  Gaspar Garbe, MD  Cardiologist:  Tyrone Sage & ***    No chief complaint on file.   History of Present Illness: Jared [name] is a 64 y.o. male who presents today for a ***  Seen for Dr. Elease Hashimoto. Former patient of Dr. Ronnald Nian.Primarily follows with me.  He has known CAD with prior CABG in 2009 with LIMA to LAD and SVG to DX. Intermediate vessel too small to bypass. His surgery was in [place].Other issues include ED, HTN and HLD.No prior history of MI noted.  SeeninNovemberof 2018- some chest pain reported - somewhat similar to prior chest pain syndrome - referred for Myoview - this was abnormal.He was working out of town and was not able to come back for follow up untilJanuaryof 2019- then referred for cath -ended up having2 vessel PCI.On DAPT.Did well following his PCI but last seen in October - some chest pain and heart "up and down". Soundedsomewhatsimilar to his prior chest pain syndrome. I added Imdurto his regimen- which did help. He then started having palpitations - placed an event monitor which showed AF/flutter - paroxsymal. Plavix was stopped and he was started on Xarelto.   Last seen by [facility] in the office in December of 2019 -he was doing well. On aspirin for CAD/prior stentalong with his Xareltofor AF.He was referred for sleep studybut did not wish to follow thru. Cardiac status ok.We have done several telehealth visits since - he has done ok - last just a few weeks ago.   Called last week with concern for a low pulse ox but noted readings in the 93 to 95% range. Wanted an in office visit. Thus added to my schedule for today.   The patient does not have symptoms concerning for COVID-19 infection (fever, chills, cough, or new shortness of breath).   Comes in today. Here alone. He notes that after our phone visit  that last week he felt like he could not get a good breath - checked his sats - 93% - he thought this was low. No chest pain. Feels fine now. He has not had any palpitations. He has missed some doses of Xarelto over the past 4 weeks - maybe 3 times. No chest pain. He is going to be off from work for the next 4 weeks - then decide about retirement.  The patient {does/does not:200015} have symptoms concerning for COVID-19 infection (fever, chills, cough, or new shortness of breath).   Comes in today. Here with   Past Medical History:  Diagnosis Date  . Chronic back pain   . Coronary artery disease    a. CABGx2 Left internal mammary artery to the LAD and a saphenous vein graft to the first diagonal with apparent intermediate coronary that was felt to be too small to bypass. b. Abnl stress test 05/2017 s/p DES to mid LCx, DES to ramus intermediate, nondominant RCA too small for PCI (med rx), normal EF.  Marland Kitchen Depression   . ED (erectile dysfunction)   . Fatigue   . Hyperlipidemia   . Hypertension   . Osteoarthritis    "qwhere" (06/24/2017)  . Sinus bradycardia     Past Surgical History:  Procedure Laterality Date  . CARDIAC CATHETERIZATION  04/18/2008   severe stenosis in the left anterior descending and what was described at that point as the first and second diagonal  vessels.  The right coronary artery was nondominant .  The left circumfles had what was felt to be moderate sesions in its midportions  . CORONARY ANGIOPLASTY WITH STENT PLACEMENT  06/24/2017  . CORONARY ARTERY BYPASS GRAFT  04/19/2008   x2 -- EF of 70% -- left internal mammary artery to the LAD and a saphenous vein graft to the first diagonal with apparent intermediate coronary that was felt to be too small to bypass  . CORONARY STENT INTERVENTION N/A 06/24/2017   Procedure: CORONARY STENT INTERVENTION;  Surgeon: Swaziland, Peter M, MD;  [facility]: [facility]AS[facility];  Service: Cardiovascular;  Laterality: N/A;  . JOINT REPLACEMENT      . LEFT HEART CATH AND CORS/GRAFTS ANGIOGRAPHY N/A 06/24/2017   Procedure: LEFT HEART CATH AND CORS/GRAFTS ANGIOGRAPHY;  Surgeon: Swaziland, Peter M, MD;  Location: [facility] INVASIVE [facility] LAB;  Service: Cardiovascular;  Laterality: N/A;  . REVISION TOTAL HIP ARTHROPLASTY Left 01/2014   "@ [address]"  . TOTAL HIP ARTHROPLASTY Left 12/2005     Medications: No outpatient medications have been marked as taking for the 11/08/19 encounter (Appointment) with Rosalio Macadamia, NP.     Allergies: Allergies  Allergen Reactions  . Zocor [Simvastatin] Other (See Comments)    myalgias    Social History: The patient  reports that he has never smoked. He has never used smokeless tobacco. He reports that he does not drink alcohol or use drugs.   Family History: The patient's ***family history includes Edema in his mother.   Review of Systems: Please see the history of present illness.   All other systems are reviewed and negative.   Physical Exam: VS:  There were no vitals taken for this visit. Marland Kitchen  BMI There is no height or weight on file to calculate BMI.  Wt Readings from Last 3 Encounters:  09/26/19 194 lb (88 kg)  05/18/18 191 lb 12.8 oz (87 kg)  04/12/18 188 lb (85.3 kg)    General: Pleasant. Well developed, well nourished and in no acute distress.   HEENT: Normal.  Neck: Supple, no JVD, carotid bruits, or masses noted.  Cardiac: ***Regular rate and rhythm. No murmurs, rubs, or gallops. No edema.  Respiratory:  Lungs are clear to auscultation bilaterally with normal work of breathing.  GI: Soft and nontender.  MS: No deformity or atrophy. Gait and ROM intact.  Skin: Warm and dry. Color is normal.  Neuro:  Strength and sensation are intact and no gross focal deficits noted.  Psych: Alert, appropriate and with normal affect.   LABORATORY DATA:  EKG:  EKG {ACTION; IS/IS [id number]} ordered today.  Personally reviewed by me. This demonstrates ***.  Lab Results  Component Value Date   WBC  6.6 05/18/2018   HGB 13.8 05/18/2018   HCT 40.4 05/18/2018   PLT 273 05/18/2018   GLUCOSE 150 (H) 05/18/2018   CHOL 162 03/03/2018   TRIG 258 (H) 03/03/2018   HDL 51 03/03/2018   LDLDIRECT 95.1 12/26/2010   LDLCALC 59 03/03/2018   ALT 14 03/03/2018   AST 14 03/03/2018   NA 137 05/18/2018   K 3.9 05/18/2018   CL 101 05/18/2018   CREATININE 0.79 05/18/2018   BUN 16 05/18/2018   CO2 23 05/18/2018   TSH 1.690 04/12/2018   INR 1.1 06/22/2017     BNP (last 3 results) No results for input(s): BNP in the last 8760 hours.  ProBNP (last 3 results) No results for input(s): PROBNP in the last 8760 hours.  Other Studies Reviewed Today:   Assessment/Plan: Result Notes for CARDIAC EVENT MONITOR   Notes recorded by Nahser, Wonda Cheng, MD on 05/17/2018 at 5:20 PM EST Sinus rhythm with occasional episodes of atrial flutter and atrial fib  The atrial fib is known He is seeing Cecille Rubin tomorrow    CORONARY STENT INTERVENTION1/2019  LEFT HEART CATH AND CORS/GRAFTS ANGIOGRAPHY  Conclusion     Prox RCA lesion is 95% stenosed.  Ost LAD to Prox LAD lesion is 100% stenosed.  Ost Ramus to Ramus lesion is 90% stenosed.  A drug-eluting stent was successfully placed using a STENT SYNERGY DES 2.5X20.  Post intervention, there is a 0% residual stenosis.  Prox Cx lesion is 95% stenosed.  Post intervention, there is a 0% residual stenosis.  A drug-eluting stent was successfully placed using a STENT SYNERGY DES 3.5X20.  SVG graft was visualized by angiography and is normal in caliber.  The graft exhibits no disease.  LIMA graft was visualized by angiography and is normal in caliber.  The graft exhibits no disease.  The left ventricular systolic function is normal.  LV end diastolic pressure is normal.  The left ventricular ejection fraction is 55-65% by visual estimate.  1. Severe 3 vessel obstructive CAD 2. Patent LIMA to the LAD 3. Patent SVG to the first diagonal 4.  Normal LV function 5. Normal LVEDP 6. Successful PCI of the mid LCx- dominant vessel with DES 7. Successful PCI of the proximal ramus intermediate with DES  Plan: DAPT for one year. Anticipate DC in am. The nondominant RCA is too small for PCI and should be managed medically.    MyoviewStudy Highlights12/2018    Nuclear stress EF: 65%. There is septal wall akinesis (post CABG septal wall changes).  There was no ST segment deviation noted during stress.  Defect 1: There is a large defect of moderate severity present in the basal inferior, mid inferior and apical inferior location.  Findings consistent with prior myocardial infarction with peri-infarct ischemia.  This is an intermediate risk study.  Candee Furbish, MD     ASSESSMENT & PLAN:   1. ?shortness of breath- his sats are fine - he is back in AF - I suspect this is the cause  2. Recurrent AF - has missed some doses of Xarelto - this was previously felt to be PAF - will see back in a month and recheck EKG - consider one time cardioversion. he now feels ok.   3. CAD - remote CABG from 2009 with cath and 2 vessel PCI to [facility] and promixal RI from January of 2019 - has known residual disease as well - managed medically. Disease in the RCA is too small for intervention.   4. Chronic anticoagulation - he has some missed doses of his Xarelto - needs 4 weeks without missed doses before we consider cardioversion.   5. HTN - BP is ok here today.   6. HLD - on statin - labs from last week noted and look good.   7. Probable OSA - discussed again - probably reason for his AF - he says we will discuss on return.   Marland Kitchen COVID-19 Education: The signs and symptoms of COVID-19 were discussed with the patient and how to seek care for testing (follow up with PCP or arrange E-visit).  The importance of social distancing, staying at home, hand hygiene and wearing a mask when out in public were discussed today.  Current  medicines are reviewed with the patient today.  The patient does not have concerns regarding medicines other than what has been noted above.  The following changes have been made:  See above.  Labs/ tests ordered today include:   No orders of the defined types were placed in this encounter.    Disposition:   FU with *** in {gen number [id number]:[id number]} {Days to years:10300}.   Patient is agreeable to this plan and will call if any problems develop in the interim.   SignedNorma Fredrickson, NP  10/26/2019 7:48 AM  [facility] [address] [place], [place]  [address] Phone: [phone] Fax: [phone]

## 2019-11-01 ENCOUNTER — Telehealth: Payer: Self-pay | Admitting: Cardiovascular Disease

## 2019-11-01 NOTE — Telephone Encounter (Signed)
Left pt 2 bottles of Xarelto 20 mg tablet downstairs for pt to pick up. Thanks lynn, LPN.

## 2019-11-01 NOTE — Telephone Encounter (Signed)
**Note De-Identified Edwards Mckelvie Obfuscation** I called [facility] pharmacy and was advised that the pts cost for Xarelto is $440/30 day supply as the pt has no ins coverage for his medications.  I called the pt who states that he is about to go on Medicare and has an appointment with them this coming Tuesday. He is requesting a 30 days worth of Xarelto samples as he feels that all he will need.  I s/w him about applying for pt asst through [employer] pt asst program but he states that will take too long and is not interested. I explained to him that we cannot give him that many samples and that he should apply for asst with [employer] because if he is approved he will receive it free of charge for the rest of this year if approved.  He stated "Ill just go pay the $440 for my refill". I advised that if he is willing to apply we will give him 2 bottles of Xarelto samples and help him out along the way if her runs low on his Xarelto and if we have samples available before a decision is made concerning his application.  He agreed and states that he will go online and print a [employer] pt asst application, complete it and will bring it to [place] so we can handle the provider part of the application and fax it to [employer] pt asst program. I offered [employer] pt asst's phone number but he states that he has it already.  Pt will pick up Xarelto 20 mg samples from office today at our Covid screening table in the lobby of [employer] office.

## 2019-11-01 NOTE — Telephone Encounter (Signed)
Epimenio Foot, LPN, this pt is requesting samples of Xarelto. Can you please advise on this matter? Please address

## 2019-11-01 NOTE — Telephone Encounter (Signed)
Patient calling the office for samples of medication:   1.  What medication and dosage are you requesting samples for? XARELTO 20 MG TABS tablet  2.  Are you currently out of this medication? No  Patient does not have insurance until next month and is requesting samples to hold him off until then.

## 2019-11-08 ENCOUNTER — Ambulatory Visit: Payer: 59 | Admitting: Nurse Practitioner

## 2019-12-01 IMAGING — DX DG CHEST 2V
2 series · 2 of 2 positions shown · non-contrast
Comparison: Chest x-ray of June 16, 2008

CLINICAL DATA: Preoperative examination prior to cardiac
catheterization on June 24, 2016. Abnormal stress test. Chest
discomfort for the past 6 months. Previous CABG. Nonsmoker.

EXAM:
CHEST  2 VIEW

[dg chest 2 view (1 of 2)]
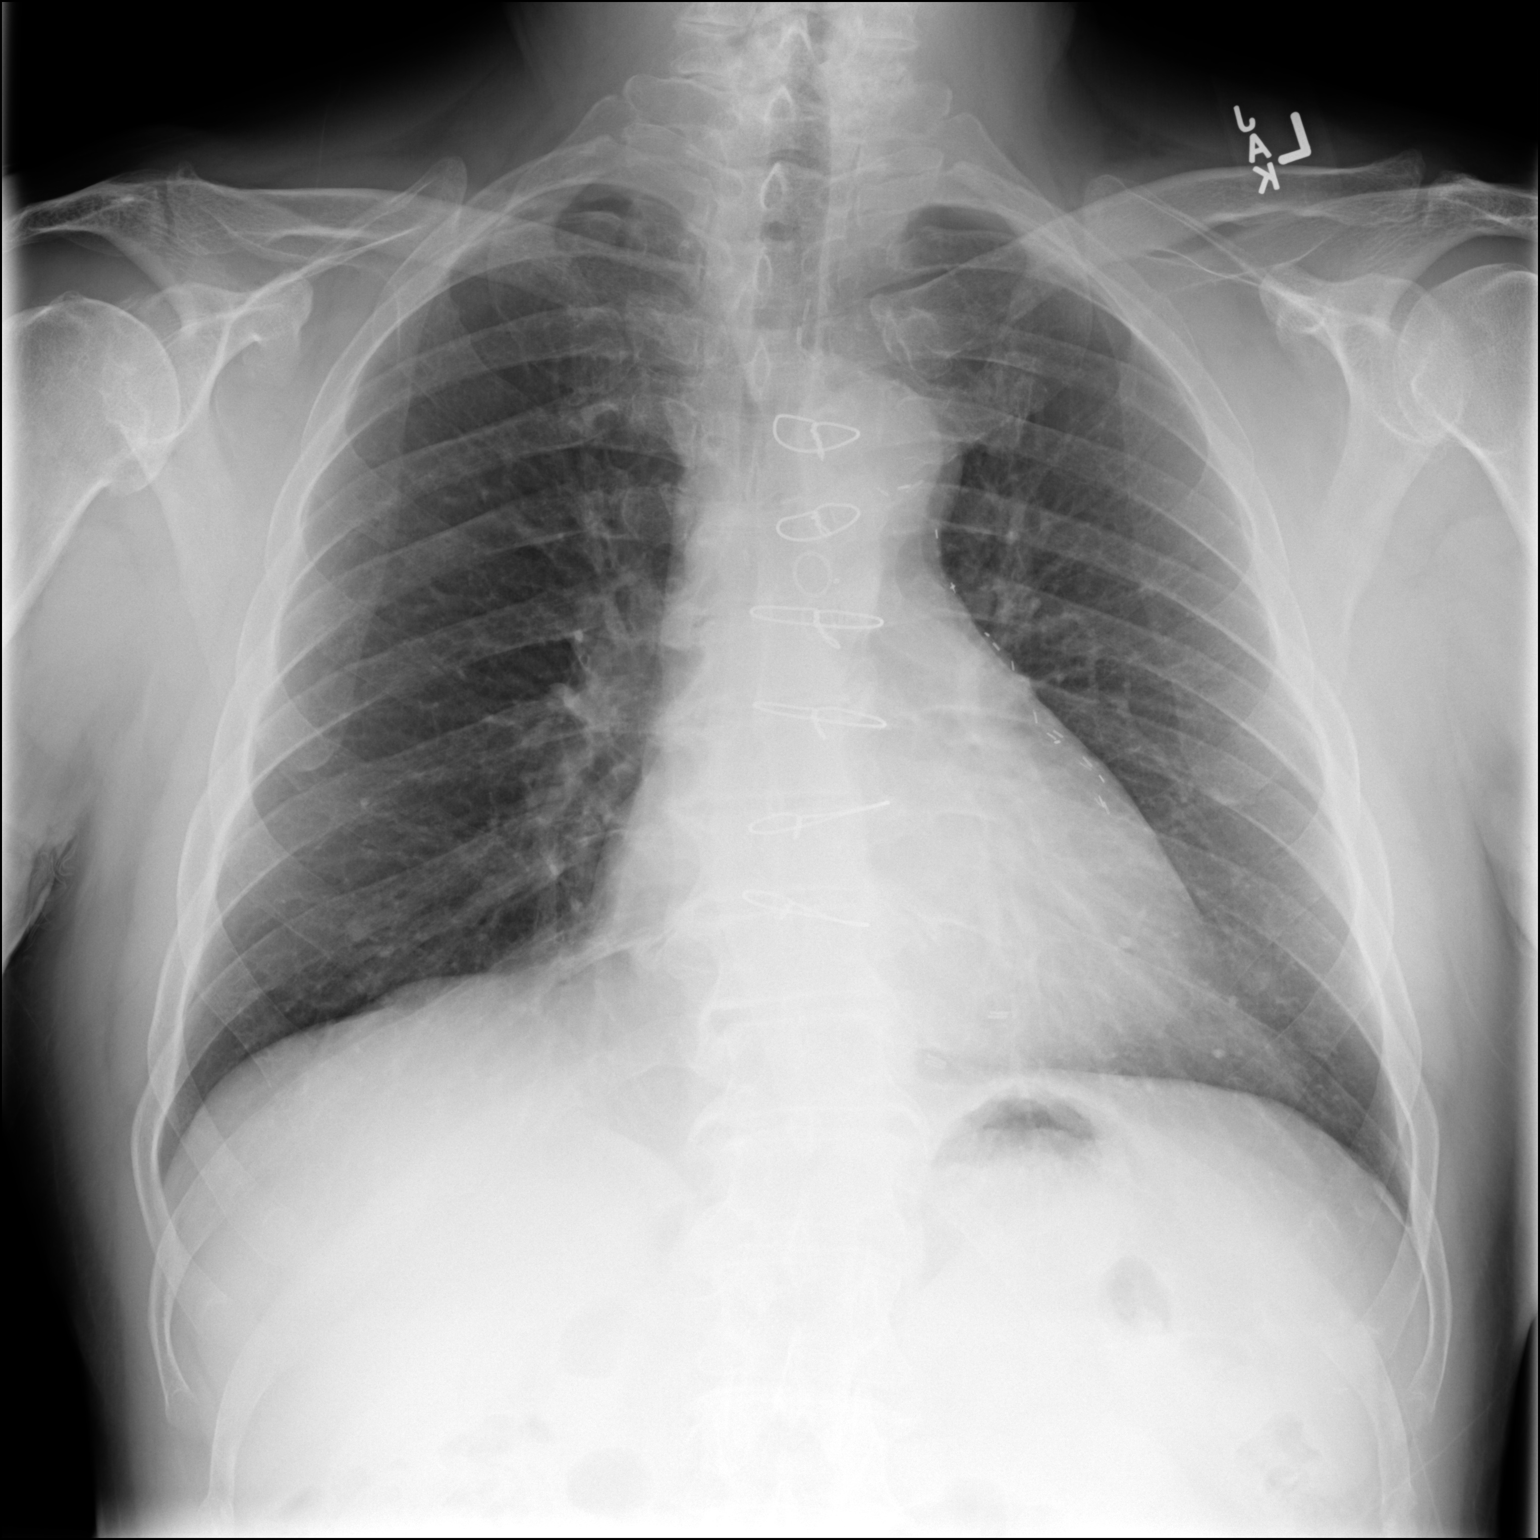

[dg chest 2 view (2 of 2)]
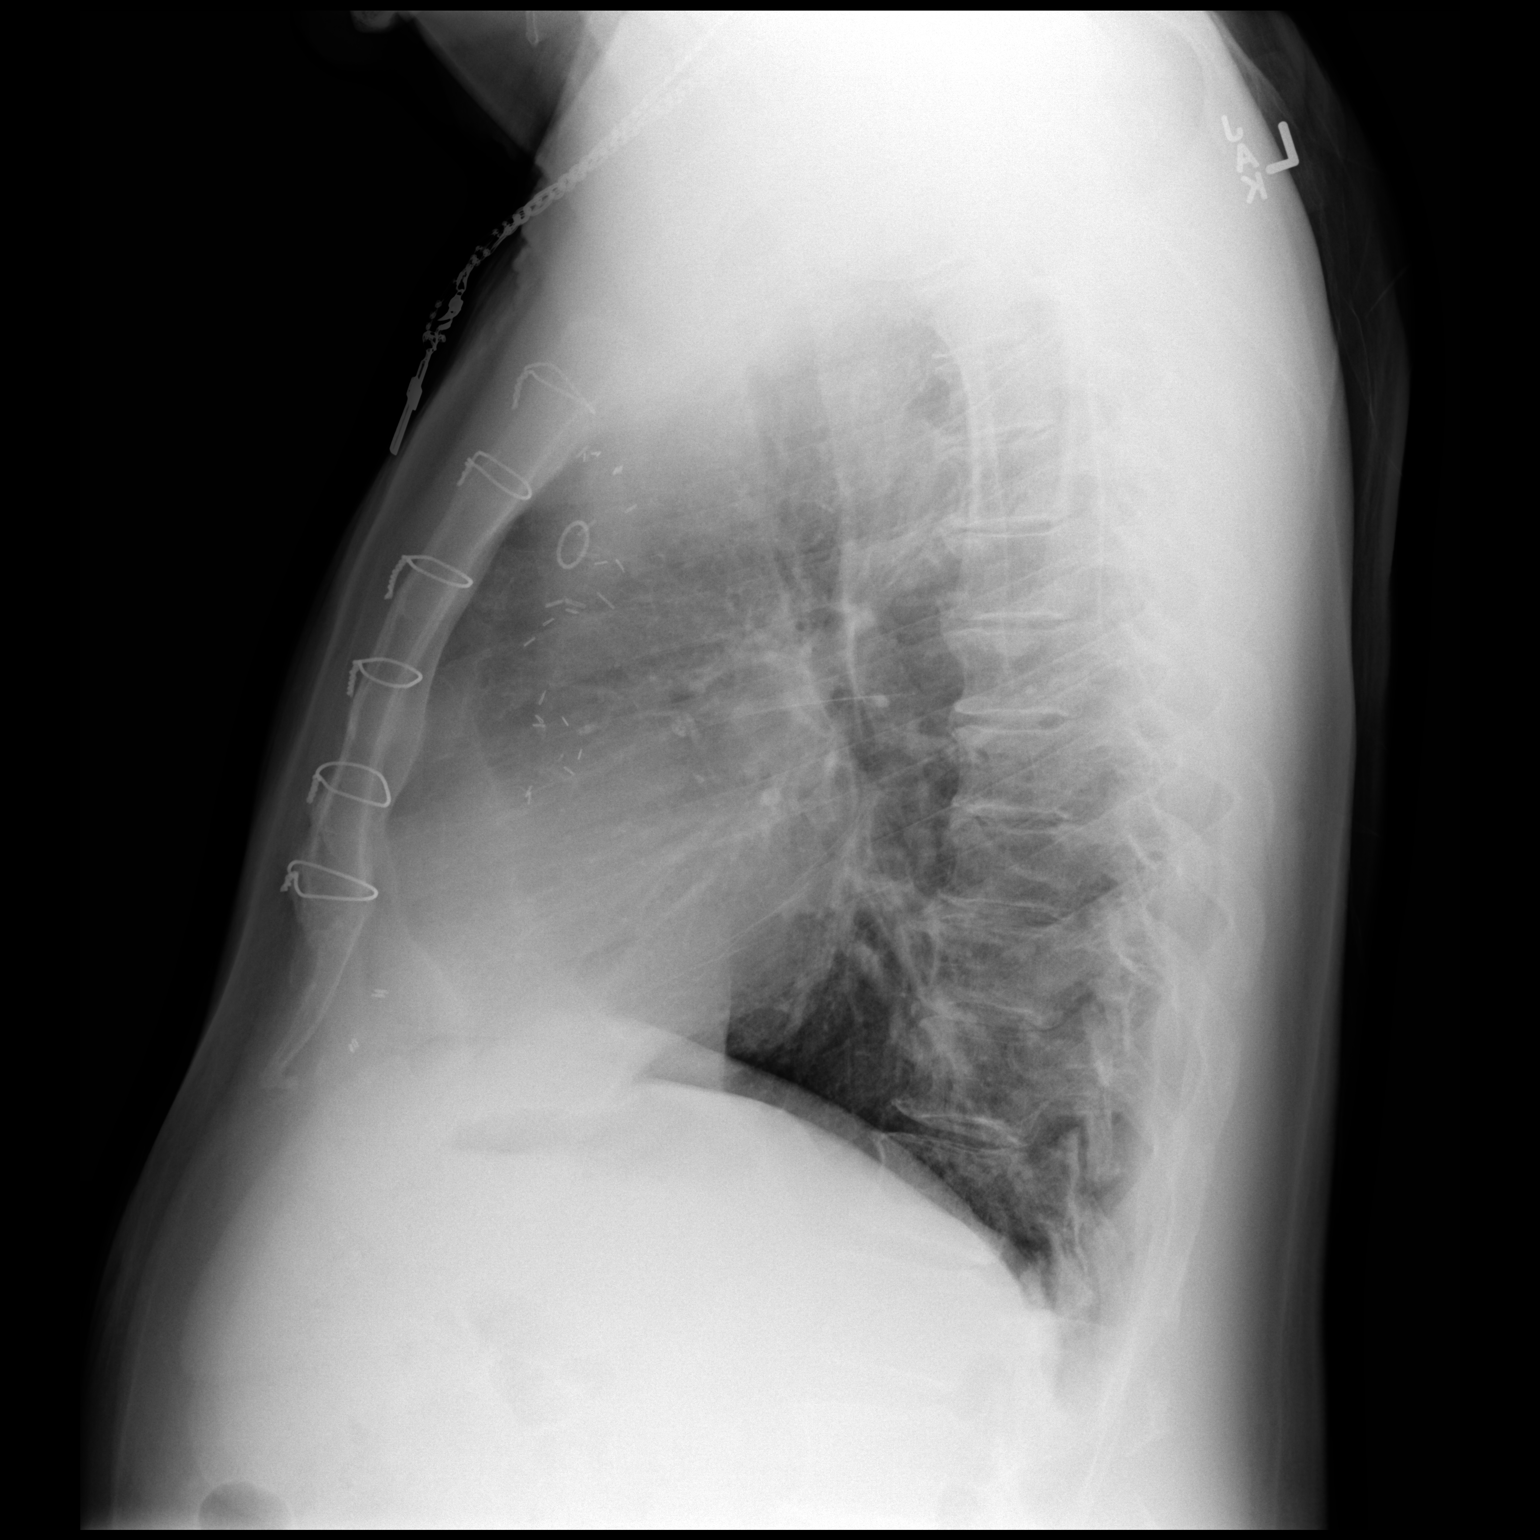

[2 of 2 positions shown; findings below may reference images not displayed]

FINDINGS: The lungs are well-expanded and clear. The heart and pulmonary
vascularity are normal. There are post CABG changes which appears
stable. The trachea is midline. There is mild multilevel
degenerative disc disease of the thoracic spine.
IMPRESSION: There is no pneumonia, CHF, nor other active cardiopulmonary
disease. Previous CABG.

## 2019-12-25 ENCOUNTER — Other Ambulatory Visit: Payer: Self-pay | Admitting: Nurse Practitioner

## 2020-01-02 NOTE — Progress Notes (Deleted)
CARDIOLOGY OFFICE NOTE  Date:  01/02/2020    Jared [name] Date of Birth: 1955/09/15 Medical Record #[id number]  PCP:  Gaspar Garbe, MD  Cardiologist:  Tyrone Sage & ***    No chief complaint on file.   History of Present Illness: Jared [name] is a 64 y.o. male who presents today for a ***  Seen for Dr. Elease Hashimoto. Former patient of Dr. Ronnald Nian.Primarily follows with me.  He has known CAD with prior CABG in 2009 with LIMA to LAD and SVG to DX. Intermediate vessel too small to bypass. His surgery was in [place].Other issues include ED, HTN and HLD.No prior history of MI noted.  SeeninNovemberof 2018- some chest pain reported - somewhat similar to prior chest pain syndrome - referred for Myoview - this was abnormal.He was working out of town and was not able to come back for follow up untilJanuaryof 2019- then referred for cath -ended up having2 vessel PCI.On DAPT.Did well following his PCI but last seen in October - some chest pain and heart "up and down". Soundedsomewhatsimilar to his prior chest pain syndrome. I added Imdurto his regimen- which did help. He then started having palpitations - placed an event monitor which showed AF/flutter - paroxsymal. Plavix was stopped and he was started on Xarelto.   Last seen by [facility] in the office in December of 2019 -he was doing well. On aspirin for CAD/prior stentalong with his Xareltofor AF.He was referred for sleep studybut did not wish to follow thru. Cardiac status ok.We have done several telehealth visits since - he has done ok - last just a few weeks ago.   Called last week with concern for a low pulse ox but noted readings in the 93 to 95% range. Wanted an in office visit. Thus added to my schedule for today.   The patient does not have symptoms concerning for COVID-19 infection (fever, chills, cough, or new shortness of breath).   Comes in today. Here alone. He notes that after our phone visit  that last week he felt like he could not get a good breath - checked his sats - 93% - he thought this was low. No chest pain. Feels fine now. He has not had any palpitations. He has missed some doses of Xarelto over the past 4 weeks - maybe 3 times. No chest pain. He is going to be off from work for the next 4 weeks - then decide about retirement.   Comes in today. Here with   Past Medical History:  Diagnosis Date  . Chronic back pain   . Coronary artery disease    a. CABGx2 Left internal mammary artery to the LAD and a saphenous vein graft to the first diagonal with apparent intermediate coronary that was felt to be too small to bypass. b. Abnl stress test 05/2017 s/p DES to mid LCx, DES to ramus intermediate, nondominant RCA too small for PCI (med rx), normal EF.  Marland Kitchen Depression   . ED (erectile dysfunction)   . Fatigue   . Hyperlipidemia   . Hypertension   . Osteoarthritis    "qwhere" (06/24/2017)  . Sinus bradycardia     Past Surgical History:  Procedure Laterality Date  . CARDIAC CATHETERIZATION  04/18/2008   severe stenosis in the left anterior descending and what was described at that point as the first and second diagonal vessels.  The right coronary artery was nondominant .  The left circumfles had what was felt to be  moderate sesions in its midportions  . CORONARY ANGIOPLASTY WITH STENT PLACEMENT  06/24/2017  . CORONARY ARTERY BYPASS GRAFT  04/19/2008   x2 -- EF of 70% -- left internal mammary artery to the LAD and a saphenous vein graft to the first diagonal with apparent intermediate coronary that was felt to be too small to bypass  . CORONARY STENT INTERVENTION N/A 06/24/2017   Procedure: CORONARY STENT INTERVENTION;  Surgeon: Swaziland, Peter M, MD;  Location: [facility] INVASIVE CV LAB;  Service: Cardiovascular;  Laterality: N/A;  . JOINT REPLACEMENT    . LEFT HEART CATH AND CORS/GRAFTS ANGIOGRAPHY N/A 06/24/2017   Procedure: LEFT HEART CATH AND CORS/GRAFTS ANGIOGRAPHY;  Surgeon: Swaziland,  Peter M, MD;  Location: [facility] INVASIVE CV LAB;  Service: Cardiovascular;  Laterality: N/A;  . REVISION TOTAL HIP ARTHROPLASTY Left 01/2014   "@ [address]"  . TOTAL HIP ARTHROPLASTY Left 12/2005     Medications: No outpatient medications have been marked as taking for the 01/10/20 encounter (Appointment) with Rosalio Macadamia, NP.     Allergies: Allergies  Allergen Reactions  . Zocor [Simvastatin] Other (See Comments)    myalgias    Social History: The patient  reports that he has never smoked. He has never used smokeless tobacco. He reports that he does not drink alcohol and does not use drugs.   Family History: The patient's ***family history includes Edema in his mother.   Review of Systems: Please see the history of present illness.   All other systems are reviewed and negative.   Physical Exam: VS:  There were no vitals taken for this visit. Marland Kitchen  BMI There is no height or weight on file to calculate BMI.  Wt Readings from Last 3 Encounters:  09/26/19 194 lb (88 kg)  05/18/18 191 lb 12.8 oz (87 kg)  04/12/18 188 lb (85.3 kg)    General: Pleasant. Well developed, well nourished and in no acute distress.   HEENT: Normal.  Neck: Supple, no JVD, carotid bruits, or masses noted.  Cardiac: ***Regular rate and rhythm. No murmurs, rubs, or gallops. No edema.  Respiratory:  Lungs are clear to auscultation bilaterally with normal work of breathing.  GI: Soft and nontender.  MS: No deformity or atrophy. Gait and ROM intact.  Skin: Warm and dry. Color is normal.  Neuro:  Strength and sensation are intact and no gross focal deficits noted.  Psych: Alert, appropriate and with normal affect.   LABORATORY DATA:  EKG:  EKG {ACTION; IS/IS XNA:[id number]} ordered today.  Personally reviewed by me. This demonstrates ***.  Lab Results  Component Value Date   WBC 6.6 05/18/2018   HGB 13.8 05/18/2018   HCT 40.4 05/18/2018   PLT 273 05/18/2018   GLUCOSE 150 (H) 05/18/2018   CHOL 162  03/03/2018   TRIG 258 (H) 03/03/2018   HDL 51 03/03/2018   LDLDIRECT 95.1 12/26/2010   LDLCALC 59 03/03/2018   ALT 14 03/03/2018   AST 14 03/03/2018   NA 137 05/18/2018   K 3.9 05/18/2018   CL 101 05/18/2018   CREATININE 0.79 05/18/2018   BUN 16 05/18/2018   CO2 23 05/18/2018   TSH 1.690 04/12/2018   INR 1.1 06/22/2017     BNP (last 3 results) No results for input(s): BNP in the last 8760 hours.  ProBNP (last 3 results) No results for input(s): PROBNP in the last 8760 hours.   Other Studies Reviewed Today:   Assessment/Plan:  Result Notes for CARDIAC EVENT MONITOR  Notes recorded by Nahser, Deloris Ping, MD on 05/17/2018 at 5:20 PM EST Sinus rhythm with occasional episodes of atrial flutter and atrial fib  The atrial fib is known He is seeing Lawson Fiscal tomorrow    CORONARY STENT INTERVENTION1/2019  LEFT HEART CATH AND CORS/GRAFTS ANGIOGRAPHY  Conclusion     Prox RCA lesion is 95% stenosed.  Ost LAD to Prox LAD lesion is 100% stenosed.  Ost Ramus to Ramus lesion is 90% stenosed.  A drug-eluting stent was successfully placed using a STENT SYNERGY DES 2.5X20.  Post intervention, there is a 0% residual stenosis.  Prox Cx lesion is 95% stenosed.  Post intervention, there is a 0% residual stenosis.  A drug-eluting stent was successfully placed using a STENT SYNERGY DES 3.5X20.  SVG graft was visualized by angiography and is normal in caliber.  The graft exhibits no disease.  LIMA graft was visualized by angiography and is normal in caliber.  The graft exhibits no disease.  The left ventricular systolic function is normal.  LV end diastolic pressure is normal.  The left ventricular ejection fraction is 55-65% by visual estimate.  1. Severe 3 vessel obstructive CAD 2. Patent LIMA to the LAD 3. Patent SVG to the first diagonal 4. Normal LV function 5. Normal LVEDP 6. Successful PCI of the mid LCx- dominant vessel with DES 7. Successful PCI of the  proximal ramus intermediate with DES  Plan: DAPT for one year. Anticipate DC in am. The nondominant RCA is too small for PCI and should be managed medically.    MyoviewStudy Highlights12/2018    Nuclear stress EF: 65%. There is septal wall akinesis (post CABG septal wall changes).  There was no ST segment deviation noted during stress.  Defect 1: There is a large defect of moderate severity present in the basal inferior, mid inferior and apical inferior location.  Findings consistent with prior myocardial infarction with peri-infarct ischemia.  This is an intermediate risk study.  Donato Schultz, MD     ASSESSMENT & PLAN:   1. ?shortness of breath- his sats are fine - he is back in AF - I suspect this is the cause  2. Recurrent AF - has missed some doses of Xarelto - this was previously felt to be PAF - will see back in a month and recheck EKG - consider one time cardioversion. he now feels ok.   3. CAD - remote CABG from 2009 with cath and 2 vessel PCI to [facility] and promixal RI from January of 2019 - has known residual disease as well - managed medically. Disease in the RCA is too small for intervention.   4. Chronic anticoagulation - he has some missed doses of his Xarelto - needs 4 weeks without missed doses before we consider cardioversion.   5. HTN - BP is ok here today.   6. HLD - on statin - labs from last week noted and look good.   7. Probable OSA - discussed again - probably reason for his AF - he says we will discuss on return.    Current medicines are reviewed with the patient today.  The patient does not have concerns regarding medicines other than what has been noted above.  The following changes have been made:  See above.  Labs/ tests ordered today include:   No orders of the defined types were placed in this encounter.    Disposition:   FU with *** in {gen number [id number]} {Days to years:10300}.   Patient is agreeable  to this  plan and will call if any problems develop in the interim.   SignedNorma Fredrickson: Corky Blumstein, NP  01/02/2020 7:35 AM  [facility] [address] [place], [place]  [address] Phone: [phone] Fax: [phone]

## 2020-01-10 ENCOUNTER — Ambulatory Visit: Payer: 59 | Admitting: Nurse Practitioner

## 2020-02-05 ENCOUNTER — Other Ambulatory Visit: Payer: Self-pay | Admitting: Cardiovascular Disease

## 2020-02-06 NOTE — Telephone Encounter (Signed)
Pt last saw [name], NP on 09/26/19, last labs 09/21/19 Creat 0.7 at [facility] per [facility], age 64, weight 88kg, CrCl 129.21, based on CrCl pt is on appropriate dosage of Xarelto 20mg  QD.  Will refill rx.

## 2020-02-20 NOTE — Progress Notes (Signed)
CARDIOLOGY OFFICE NOTE  Date:  02/27/2020    Rodena Goldmann Witters Date of Birth: 08/25/1955 Medical Record #[id number]  PCP:  Gaspar Garbe, MD  Cardiologist:  Tyrone Sage & Nahser   Chief Complaint  Patient presents with  . Follow-up    Seen for Dr. Elease Hashimoto    History of Present Illness: Nihar Klus Antonopoulos is a 64 y.o. male who presents today for a follow up visit. Seen for Dr. Elease Hashimoto. Former patient of Dr. Ronnald Nian.Primarily follows with me.  He has known CAD with prior CABG in 2009 with LIMA to LAD and SVG to DX. Intermediate vessel too small to bypass. His surgery was in [place].Other issues include ED, HTN and HLD.No prior history of MI noted.  SeeninNovemberof 2018- some chest pain reported - somewhat similar to prior chest pain syndrome - referred for Myoview - this was abnormal.He was working out of town and was not able to come back for follow up untilJanuaryof 2019- then referred for cath -ended up having2 vessel PCI.On DAPT.Did well following his PCI but last seen in October - some chest pain and heart "up and down". Soundedsomewhatsimilar to his prior chest pain syndrome. I added Imdurto his regimen- which did help. He then started having palpitations - placed an event monitor which showed AF/flutter - paroxsymal. Plavix was stopped and he was started on Xarelto.   When seen by [facility] in the office in December of 2019 -he was doing well. On aspirin for CAD/prior stentalong with his Xareltofor AF.He was referred for sleep studybut did not wish to follow thru. Cardiac status ok.We then did several telehealth visits - he has done ok. Last seen here in the office back in May - concerned for low pulse ox readings - but these were in the 93 to 95% range. He was in Af - he was not aware - he had missed some doses of Xarelto - we opted to see back in a month - he has since rescheduled several times.   Comes in today. Here with his girlfriend today. He is  feeling good. No chest pain. Not short of breath. Not dizzy or lightheaded. He feels like he is doing well. Does endorse snoring and sometime daytime somnolence. He has no awareness of his AF. He is on his Xarelto. He has previously missed some doses of Xarelto. No bleeding. He has an upcoming OV with PCP and should be getting labs.   Past Medical History:  Diagnosis Date  . Chronic back pain   . Coronary artery disease    a. CABGx2 Left internal mammary artery to the LAD and a saphenous vein graft to the first diagonal with apparent intermediate coronary that was felt to be too small to bypass. b. Abnl stress test 05/2017 s/p DES to mid LCx, DES to ramus intermediate, nondominant RCA too small for PCI (med rx), normal EF.  Marland Kitchen Depression   . ED (erectile dysfunction)   . Fatigue   . Hyperlipidemia   . Hypertension   . Osteoarthritis    "qwhere" (06/24/2017)  . Sinus bradycardia     Past Surgical History:  Procedure Laterality Date  . CARDIAC CATHETERIZATION  04/18/2008   severe stenosis in the left anterior descending and what was described at that point as the first and second diagonal vessels.  The right coronary artery was nondominant .  The left circumfles had what was felt to be moderate sesions in its midportions  . CORONARY ANGIOPLASTY WITH STENT PLACEMENT  06/24/2017  . CORONARY ARTERY BYPASS GRAFT  04/19/2008   x2 -- EF of 70% -- left internal mammary artery to the LAD and a saphenous vein graft to the first diagonal with apparent intermediate coronary that was felt to be too small to bypass  . CORONARY STENT INTERVENTION N/A 06/24/2017   Procedure: CORONARY STENT INTERVENTION;  Surgeon: Swaziland, Peter M, MD;  Location: [facility] INVASIVE CV LAB;  Service: Cardiovascular;  Laterality: N/A;  . JOINT REPLACEMENT    . LEFT HEART CATH AND CORS/GRAFTS ANGIOGRAPHY N/A 06/24/2017   Procedure: LEFT HEART CATH AND CORS/GRAFTS ANGIOGRAPHY;  Surgeon: Swaziland, Peter M, MD;  Location: [facility] INVASIVE CV LAB;   Service: Cardiovascular;  Laterality: N/A;  . REVISION TOTAL HIP ARTHROPLASTY Left 01/2014   "@ [address]"  . TOTAL HIP ARTHROPLASTY Left 12/2005     Medications: Current Meds  Medication Sig  . acetaminophen (TYLENOL) 500 MG tablet Take 1,000 mg by mouth every 6 (six) hours as needed for mild pain (back).  Marland Kitchen aspirin EC 81 MG tablet Take 1 tablet (81 mg total) by mouth daily.  . isosorbide mononitrate (IMDUR) 30 MG 24 hr tablet TAKE 1 TABLET(30 MG) BY MOUTH DAILY  . metoprolol tartrate (LOPRESSOR) 25 MG tablet TAKE 1 TABLET(25 MG) BY MOUTH TWICE DAILY  . nitroGLYCERIN (NITROSTAT) 0.4 MG SL tablet TAKE 1 TABLET UNDER THE TONGUE EVERY 5 MINUTES AS NEEDED FOR CHEST PAIN  . Omega-3 Fatty Acids (FISH OIL PO) Take 1,400 mcg by mouth daily.  . rosuvastatin (CRESTOR) 5 MG tablet TAKE 1 TABLET(5 MG) BY MOUTH AT BEDTIME  . sertraline (ZOLOFT) 100 MG tablet Take 100 mg by mouth daily.   Marland Kitchen tiZANidine (ZANAFLEX) 4 MG tablet Take 4 mg by mouth daily as needed.   Carlena Hurl 20 MG TABS tablet TAKE 1 TABLET BY MOUTH EVERY EVENING WITH FOOD     Allergies: Allergies  Allergen Reactions  . Zocor [Simvastatin] Other (See Comments)    myalgias    Social History: The patient  reports that he has never smoked. He has never used smokeless tobacco. He reports that he does not drink alcohol and does not use drugs.   Family History: The patient's family history includes Edema in his mother.   Review of Systems: Please see the history of present illness.   All other systems are reviewed and negative.   Physical Exam: VS:  BP 118/72   Pulse 72   Ht 5\' 8"  (1.727 m)   Wt 199 lb (90.3 kg)   SpO2 95%   BMI 30.26 kg/m  .  BMI Body mass index is 30.26 kg/m.  Wt Readings from Last 3 Encounters:  02/27/20 199 lb (90.3 kg)  09/26/19 194 lb (88 kg)  05/18/18 191 lb 12.8 oz (87 kg)    General: Pleasant. Alert and in no acute distress.   Cardiac: Irregular irregular rhythm - his rate is ok. No murmurs,  rubs, or gallops. No edema.  Respiratory:  Lungs are clear to auscultation bilaterally with normal work of breathing.  GI: Soft and nontender.  MS: No deformity or atrophy. Gait and ROM intact.  Skin: Warm and dry. Color is normal.  Neuro:  Strength and sensation are intact and no gross focal deficits noted.  Psych: Alert, appropriate and with normal affect.   LABORATORY DATA:  EKG:  EKG is ordered today.  Personally reviewed by me. This demonstrates AF with controlled VR - HR is 72.  Lab Results  Component Value Date  WBC 6.6 05/18/2018   HGB 13.8 05/18/2018   HCT 40.4 05/18/2018   PLT 273 05/18/2018   GLUCOSE 150 (H) 05/18/2018   CHOL 162 03/03/2018   TRIG 258 (H) 03/03/2018   HDL 51 03/03/2018   LDLDIRECT 95.1 12/26/2010   LDLCALC 59 03/03/2018   ALT 14 03/03/2018   AST 14 03/03/2018   NA 137 05/18/2018   K 3.9 05/18/2018   CL 101 05/18/2018   CREATININE 0.79 05/18/2018   BUN 16 05/18/2018   CO2 23 05/18/2018   TSH 1.690 04/12/2018   INR 1.1 06/22/2017       BNP (last 3 results) No results for input(s): BNP in the last 8760 hours.  ProBNP (last 3 results) No results for input(s): PROBNP in the last 8760 hours.   Other Studies Reviewed Today:  Result Notes for CARDIAC EVENT MONITOR   Notes recorded by Nahser, Deloris PingPhilip J, MD on 05/17/2018 at 5:20 PM EST Sinus rhythm with occasional episodes of atrial flutter and atrial fib  The atrial fib is known He is seeing Lawson FiscalLori tomorrow    CORONARY STENT INTERVENTION1/2019  LEFT HEART CATH AND CORS/GRAFTS ANGIOGRAPHY  Conclusion     Prox RCA lesion is 95% stenosed.  Ost LAD to Prox LAD lesion is 100% stenosed.  Ost Ramus to Ramus lesion is 90% stenosed.  A drug-eluting stent was successfully placed using a STENT SYNERGY DES 2.5X20.  Post intervention, there is a 0% residual stenosis.  Prox Cx lesion is 95% stenosed.  Post intervention, there is a 0% residual stenosis.  A drug-eluting stent was  successfully placed using a STENT SYNERGY DES 3.5X20.  SVG graft was visualized by angiography and is normal in caliber.  The graft exhibits no disease.  LIMA graft was visualized by angiography and is normal in caliber.  The graft exhibits no disease.  The left ventricular systolic function is normal.  LV end diastolic pressure is normal.  The left ventricular ejection fraction is 55-65% by visual estimate.  1. Severe 3 vessel obstructive CAD 2. Patent LIMA to the LAD 3. Patent SVG to the first diagonal 4. Normal LV function 5. Normal LVEDP 6. Successful PCI of the mid LCx- dominant vessel with DES 7. Successful PCI of the proximal ramus intermediate with DES  Plan: DAPT for one year. Anticipate DC in am. The nondominant RCA is too small for PCI and should be managed medically.    MyoviewStudy Highlights12/2018    Nuclear stress EF: 65%. There is septal wall akinesis (post CABG septal wall changes).  There was no ST segment deviation noted during stress.  Defect 1: There is a large defect of moderate severity present in the basal inferior, mid inferior and apical inferior location.  Findings consistent with prior myocardial infarction with peri-infarct ischemia.  This is an intermediate risk study.  Donato SchultzMark Skains, MD     ASSESSMENT & PLAN:   1. AF - he denies being symptomatic at this time and is adamant that he feels good. He is agreeable to getting a sleep study. His rate is controlled. He remains on Xarelto. Will hold on cardioversion and continue anticoagulation.   2. Probable OSA - he is agreeable to getting a sleep study.   3. CAD - with remote CABG from 2009 with cath and [facility] and proximal RI from January 2019 - known residual disease - managed medically. Disease in the RCA is too small for intervention.   4. Chronic anticoagulation - no problems  noted - reminded to take this regularly.   5. HTN - BP is fine here today -  would continue on current regimen.   6. HLD - on statin - labs upcoming with PCP  Current medicines are reviewed with the patient today.  The patient does not have concerns regarding medicines other than what has been noted above.  The following changes have been made:  See above.  Labs/ tests ordered today include:    Orders Placed This Encounter  Procedures  . EKG 12-Lead     Disposition:   FU with Dr. Elease Hashimoto in 6 months.    Patient is agreeable to this plan and will call if any problems develop in the interim.   SignedNorma Fredrickson, NP  02/27/2020 2:56 PM  [facility] [address] [place], [place]  [address] Phone: [phone] Fax: [phone]

## 2020-02-27 ENCOUNTER — Ambulatory Visit (INDEPENDENT_AMBULATORY_CARE_PROVIDER_SITE_OTHER): Payer: Medicare Other | Admitting: Nurse Practitioner

## 2020-02-27 ENCOUNTER — Other Ambulatory Visit: Payer: Self-pay

## 2020-02-27 ENCOUNTER — Other Ambulatory Visit: Payer: Self-pay | Admitting: Nurse Practitioner

## 2020-02-27 ENCOUNTER — Encounter: Payer: Self-pay | Admitting: Nurse Practitioner

## 2020-02-27 VITALS — BP 118/72 | HR 72 | Ht 68.0 in | Wt 199.0 lb

## 2020-02-27 DIAGNOSIS — I480 Paroxysmal atrial fibrillation: Secondary | ICD-10-CM | POA: Diagnosis not present

## 2020-02-27 DIAGNOSIS — I10 Essential (primary) hypertension: Secondary | ICD-10-CM

## 2020-02-27 DIAGNOSIS — E7849 Other hyperlipidemia: Secondary | ICD-10-CM | POA: Diagnosis not present

## 2020-02-27 DIAGNOSIS — I259 Chronic ischemic heart disease, unspecified: Secondary | ICD-10-CM | POA: Diagnosis not present

## 2020-02-27 NOTE — Patient Instructions (Addendum)
After Visit Summary:  We will be checking the following labs today - NONE   Medication Instructions:    Continue with your current medicines.    If you need a refill on your cardiac medications before your next appointment, please call your pharmacy.     Testing/Procedures To Be Arranged:  N/A  Follow-Up:   See Dr. Nahser in 6 months.     At [facility], you and your health needs are our priority.  As part of our continuing mission to provide you with exceptional heart care, we have created designated Provider Care Teams.  These Care Teams include your primary Cardiologist (physician) and Advanced Practice Providers (APPs -  Physician Assistants and Nurse Practitioners) who all work together to provide you with the care you need, when you need it.  Special Instructions:  . Stay safe, wash your hands for at least 20 seconds and wear a mask when needed.  . It was good to talk with you today.    Call the [facility] office at [phone] if you have any questions, problems or concerns.       

## 2020-02-29 ENCOUNTER — Telehealth: Payer: Self-pay | Admitting: *Deleted

## 2020-02-29 ENCOUNTER — Other Ambulatory Visit: Payer: Self-pay | Admitting: *Deleted

## 2020-02-29 DIAGNOSIS — R400 Somnolence: Secondary | ICD-10-CM

## 2020-02-29 NOTE — Telephone Encounter (Signed)
Per [name] sleep study ordered.

## 2020-03-06 NOTE — Telephone Encounter (Signed)
Order sent to sleep pool for precert. °

## 2020-03-30 ENCOUNTER — Other Ambulatory Visit: Payer: Self-pay | Admitting: Nurse Practitioner

## 2020-05-28 ENCOUNTER — Other Ambulatory Visit: Payer: Self-pay | Admitting: Cardiovascular Disease

## 2020-06-11 ENCOUNTER — Other Ambulatory Visit: Payer: Self-pay | Admitting: Pharmacist

## 2020-06-11 MED ORDER — RIVAROXABAN 20 MG PO TABS: 20.0000 mg | ORAL_TABLET | Freq: Every day | ORAL | 3 refills | Status: DC

## 2020-07-11 ENCOUNTER — Other Ambulatory Visit: Payer: Self-pay | Admitting: Nurse Practitioner

## 2020-07-29 NOTE — Progress Notes (Signed)
Cardiology Office Note:    Date:  07/30/2020   ID:  Onalee Hua Pennella, DOB 1955/07/23, MRN [id number]  PCP:  Haywood Pao, MD   [place]  Cardiologist: previous Wendi Snipes , now Freight forwarder Provider:  No care team member to display Electrophysiologist:  None  [id number]}   Referring MD: Haywood Pao, MD   No chief complaint on file.    July 30, 2020:    Jared [name] is a 65 y.o. male with a hx of CAD and CABG.  Had several stents placed several years ago .  Previous [facility] patient and saw Cecille Rubin for years .   This the first time I have met him Hx of atrial fib , is on xarelto  No CP or dysapnea. Not much exercise  Works [employer]    Past Medical History:  Diagnosis Date  . Chronic back pain   . Coronary artery disease    a. CABGx2 Left internal mammary artery to the LAD and a saphenous vein graft to the first diagonal with apparent intermediate coronary that was felt to be too small to bypass. b. Abnl stress test 05/2017 s/p DES to mid LCx, DES to ramus intermediate, nondominant RCA too small for PCI (med rx), normal EF.  Marland Kitchen Depression   . ED (erectile dysfunction)   . Fatigue   . Hyperlipidemia   . Hypertension   . Osteoarthritis    "qwhere" (06/24/2017)  . Sinus bradycardia     Past Surgical History:  Procedure Laterality Date  . CARDIAC CATHETERIZATION  04/18/2008   severe stenosis in the left anterior descending and what was described at that point as the first and second diagonal vessels.  The right coronary artery was nondominant .  The left circumfles had what was felt to be moderate sesions in its midportions  . CORONARY ANGIOPLASTY WITH STENT PLACEMENT  06/24/2017  . CORONARY ARTERY BYPASS GRAFT  04/19/2008   x2 -- EF of 70% -- left internal mammary artery to the LAD and a saphenous vein graft to the first diagonal with apparent intermediate coronary that was felt to be too small to bypass  . CORONARY STENT  INTERVENTION N/A 06/24/2017   Procedure: CORONARY STENT INTERVENTION;  Surgeon: Martinique, Peter M, MD;  Location: [place] [facility];  Service: Cardiovascular;  Laterality: N/A;  . JOINT REPLACEMENT    . LEFT HEART CATH AND CORS/GRAFTS ANGIOGRAPHY N/A 06/24/2017   Procedure: LEFT HEART CATH AND CORS/GRAFTS ANGIOGRAPHY;  Surgeon: Martinique, Peter M, MD;  Location: [place] [facility];  Service: Cardiovascular;  Laterality: N/A;  . REVISION TOTAL HIP ARTHROPLASTY Left 01/2014   "@ [facility]"  . TOTAL HIP ARTHROPLASTY Left 12/2005    Current Medications: Current Meds  Medication Sig  . acetaminophen (TYLENOL) 500 MG tablet Take 1,000 mg by mouth every 6 (six) hours as needed for mild pain (back).  Marland Kitchen aspirin EC 81 MG tablet Take 1 tablet (81 mg total) by mouth daily.  Marland Kitchen gabapentin (NEURONTIN) 600 MG tablet Take 1 tablet by mouth as directed.  . isosorbide mononitrate (IMDUR) 30 MG 24 hr tablet TAKE 1 TABLET(30 MG) BY MOUTH DAILY  . metFORMIN (GLUCOPHAGE) 500 MG tablet Take 500 mg by mouth daily.  . metoprolol tartrate (LOPRESSOR) 25 MG tablet TAKE 1 TABLET(25 MG) BY MOUTH TWICE DAILY  . nitroGLYCERIN (NITROSTAT) 0.4 MG SL tablet TAKE 1 TABLET UNDER THE TONGUE EVERY 5 MINUTES AS NEEDED FOR CHEST PAIN  .  Omega-3 Fatty Acids (FISH OIL PO) Take 1,400 mcg by mouth daily.  . rivaroxaban (XARELTO) 20 MG TABS tablet Take 1 tablet (20 mg total) by mouth daily with supper. FOLLOW UP DUE IN MARCH 2022. PLEASE CALL AND SCHEDULE  . rosuvastatin (CRESTOR) 5 MG tablet TAKE 1 TABLET(5 MG) BY MOUTH AT BEDTIME  . sertraline (ZOLOFT) 100 MG tablet Take 100 mg by mouth daily.   Marland Kitchen tiZANidine (ZANAFLEX) 4 MG tablet Take 4 mg by mouth daily as needed.      Allergies:   Zocor [simvastatin]   Social History   Socioeconomic History  . Marital status: Divorced    Spouse name: Not on file  . Number of children: Not on file  . Years of education: Not on file  . Highest education level: Not on file  Occupational History   . Not on file  Tobacco Use  . Smoking status: Never Smoker  . Smokeless tobacco: Never Used  Vaping Use  . Vaping Use: Never used  Substance and Sexual Activity  . Alcohol use: No  . Drug use: No  . Sexual activity: Yes  Other Topics Concern  . Not on file  Social History Narrative  . Not on file   Social Determinants of Health   Financial Resource Strain: Not on file  Food Insecurity: Not on file  Transportation Needs: Not on file  Physical Activity: Not on file  Stress: Not on file  Social Connections: Not on file     Family History: The patient's family history includes Edema in his mother.  ROS:   Please see the history of present illness.     All other systems reviewed and are negative.  EKGs/Labs/Other Studies Reviewed:    The following studies were reviewed today:   EKG:     Recent Labs: No results found for requested labs within last 8760 hours.  Recent Lipid Panel    Component Value Date/Time   CHOL 162 03/03/2018 1202   TRIG 258 (H) 03/03/2018 1202   HDL 51 03/03/2018 1202   CHOLHDL 3.2 03/03/2018 1202   CHOLHDL 3 12/26/2010 0818   VLDL 43.4 (H) 12/26/2010 0818   LDLCALC 59 03/03/2018 1202   LDLDIRECT 95.1 12/26/2010 0818     Risk Assessment/Calculations:    CHA2DS2-VASc Score = 2  This indicates a 2.2% annual risk of stroke. The patient's score is based upon: CHF History: No HTN History: No Diabetes History: No Stroke History: No Vascular Disease History: Yes Age Score: 1 Gender Score: 0      Physical Exam:    VS:  BP 128/76   Pulse 76   Ht 5' 8"  (1.727 m)   Wt 193 lb (87.5 kg)   SpO2 96%   BMI 29.35 kg/m     Wt Readings from Last 3 Encounters:  07/30/20 193 lb (87.5 kg)  02/27/20 199 lb (90.3 kg)  09/26/19 194 lb (88 kg)     GEN:  Well nourished, well developed in no acute distress HEENT: Normal NECK: No JVD; No carotid bruits LYMPHATICS: No lymphadenopathy CARDIAC: Irreg. Irreg. , soft systolic murmur   RESPIRATORY:  Clear to auscultation without rales, wheezing or rhonchi  ABDOMEN: Soft, non-tender, non-distended MUSCULOSKELETAL:  No edema; No deformity  SKIN: Warm and dry NEUROLOGIC:  Alert and oriented x 3 PSYCHIATRIC:  Normal affect   ASSESSMENT:    1. Coronary artery disease involving native coronary artery of native heart without angina pectoris   2. Mixed hyperlipidemia  3. Persistent atrial fibrillation ([place])    PLAN:    :  1. CAD :  No angina .   Cont current meds.   2.  Atrial fib:   Has persistent AFib  Cont xarelto           Medication Adjustments/Labs and Tests Ordered: Current medicines are reviewed at length with the patient today.  Concerns regarding medicines are outlined above.  No orders of the defined types were placed in this encounter.  No orders of the defined types were placed in this encounter.    Patient Instructions  Medication Instructions:  Your physician recommends that you continue on your current medications as directed. Please refer to the Current Medication list given to you today.  *If you need a refill on your cardiac medications before your next appointment, please call your pharmacy*   Lab Work: none    Testing/Procedures: none   Follow-Up: At [facility], you and your health needs are our priority.  As part of our continuing mission to provide you with exceptional heart care, we have created designated Provider Care Teams.  These Care Teams include your primary Cardiologist (physician) and Advanced Practice Providers (APPs -  Physician Assistants and Nurse Practitioners) who all work together to provide you with the care you need, when you need it.  We recommend signing up for the patient portal called "MyChart".  Sign up information is provided on this After Visit Summary.  MyChart is used to connect with patients for Virtual Visits (Telemedicine).  Patients are able to view lab/test results, encounter notes,  upcoming appointments, etc.  Non-urgent messages can be sent to your provider as well.   To learn more about what you can do with MyChart, go to [email or web address].    Your next appointment:   1 year(s)  The format for your next appointment:   In Person  Provider:   You may see Dr. Acie Fredrickson or one of the following Advanced Practice Providers on your designated Care Team:    Richardson Dopp, PA-C  Robbie Lis, [place]     Signed, Mertie Moores, MD  07/30/2020 3:53 PM    [place]

## 2020-07-30 ENCOUNTER — Ambulatory Visit: Payer: Medicare Other | Admitting: Cardiovascular Disease

## 2020-07-30 ENCOUNTER — Encounter: Payer: Self-pay | Admitting: Cardiovascular Disease

## 2020-07-30 ENCOUNTER — Other Ambulatory Visit: Payer: Self-pay

## 2020-07-30 VITALS — BP 128/76 | HR 76 | Ht 68.0 in | Wt 193.0 lb

## 2020-07-30 DIAGNOSIS — I2510 Atherosclerotic heart disease of native coronary artery without angina pectoris: Secondary | ICD-10-CM | POA: Diagnosis not present

## 2020-07-30 DIAGNOSIS — I4819 Other persistent atrial fibrillation: Secondary | ICD-10-CM

## 2020-07-30 DIAGNOSIS — E782 Mixed hyperlipidemia: Secondary | ICD-10-CM

## 2020-07-30 DIAGNOSIS — I4891 Unspecified atrial fibrillation: Secondary | ICD-10-CM | POA: Insufficient documentation

## 2020-07-30 NOTE — Patient Instructions (Signed)
Medication Instructions:  Your physician recommends that you continue on your current medications as directed. Please refer to the Current Medication list given to you today.  *If you need a refill on your cardiac medications before your next appointment, please call your pharmacy*   Lab Work: none    Testing/Procedures: none   Follow-Up: At [facility], you and your health needs are our priority.  As part of our continuing mission to provide you with exceptional heart care, we have created designated Provider Care Teams.  These Care Teams include your primary Cardiologist (physician) and Advanced Practice Providers (APPs -  Physician Assistants and Nurse Practitioners) who all work together to provide you with the care you need, when you need it.  We recommend signing up for the patient portal called "MyChart".  Sign up information is provided on this After Visit Summary.  MyChart is used to connect with patients for Virtual Visits (Telemedicine).  Patients are able to view lab/test results, encounter notes, upcoming appointments, etc.  Non-urgent messages can be sent to your provider as well.   To learn more about what you can do with MyChart, go to [email or web address].    Your next appointment:   1 year(s)  The format for your next appointment:   In Person  Provider:   You may see Dr. Elease Hashimoto or one of the following Advanced Practice Providers on your designated Care Team:    Tereso Newcomer, PA-C  Vin Jansen, [place]

## 2020-08-17 ENCOUNTER — Other Ambulatory Visit: Payer: Self-pay

## 2020-08-17 MED ORDER — METOPROLOL TARTRATE 25 MG PO TABS: 25.0000 mg | ORAL_TABLET | Freq: Two times a day (BID) | ORAL | 3 refills | Status: DC

## 2020-11-09 ENCOUNTER — Other Ambulatory Visit: Payer: Self-pay | Admitting: Cardiovascular Disease

## 2020-11-09 NOTE — Telephone Encounter (Signed)
Labs received from PCP. BMET checked on 10/02/20, SCr 0.8. CrCl > 100, refill sent in

## 2020-11-09 NOTE — Telephone Encounter (Signed)
Age 65, weight 87.5kg, SCr 0.7 on 09/21/19, CrCl > 100, now overdue for annual lab work.   Called PCP and left message with medical records to see if they have upcoming appt scheduled with pt since he's had 4 visits with [facility] providers in the last year but no one checked labs.

## 2020-11-28 ENCOUNTER — Telehealth: Payer: Self-pay | Admitting: Cardiovascular Disease

## 2020-11-28 MED ORDER — ISOSORBIDE MONONITRATE ER 30 MG PO TB24: ORAL_TABLET | ORAL | 3 refills | Status: DC

## 2020-11-28 NOTE — Telephone Encounter (Signed)
Pt's medication was sent to pt's pharmacy as requested. Confirmation received.  °

## 2020-11-28 NOTE — Telephone Encounter (Signed)
*  STAT* If patient is at the pharmacy, call can be transferred to refill team.   1. Which medications need to be refilled? (please list name of each medication and dose if known) isosorbide mononitrate (IMDUR) 30 MG 24 hr tablet  2. Which pharmacy/location (including street and city if local pharmacy) is medication to be sent to? [id number] - [place], [place] - [address]  3. Do they need a 30 day or 90 day supply? 90 day supply    Pt is out medication

## 2021-02-18 ENCOUNTER — Other Ambulatory Visit: Payer: Self-pay

## 2021-02-18 MED ORDER — ROSUVASTATIN CALCIUM 5 MG PO TABS: ORAL_TABLET | ORAL | 1 refills | Status: DC

## 2021-04-12 NOTE — Addendum Note (Signed)
Addended by: Reesa Chew on: 04/12/2021 11:07 AM   Modules accepted: Orders

## 2021-04-12 NOTE — Telephone Encounter (Signed)
Prior Authorization for SPLIT NIGHT sent to [facility] via web portal.   Prior Authorization is not required for the requested services  Decision ID #:[id number].

## 2021-05-15 ENCOUNTER — Other Ambulatory Visit: Payer: Self-pay | Admitting: Cardiovascular Disease

## 2021-05-23 NOTE — Telephone Encounter (Signed)
Patient is scheduled for lab study on 06-13-20. Patient understands her sleep study will be done at [facility] sleep lab. Patient understands she will receive a sleep packet in a week or so. Patient understands to call if she does not receive the sleep packet in a timely manner. Patient agrees with treatment and thanked me for call.

## 2021-06-07 ENCOUNTER — Other Ambulatory Visit: Payer: Self-pay

## 2021-06-07 MED ORDER — RIVAROXABAN 20 MG PO TABS: 20.0000 mg | ORAL_TABLET | Freq: Every day | ORAL | 5 refills | Status: DC

## 2021-06-07 NOTE — Telephone Encounter (Signed)
Prescription refill request for Xarelto received.  Indication: Afib  Last office visit:07/30/20 (Nahser)  Weight: 87.5kg Age: 66 Scr: 0.8 (10/02/20)  CrCl: 110.77ml/min  Appropriate dose and refill sent to requested pharmacy.

## 2021-06-13 ENCOUNTER — Encounter (HOSPITAL_BASED_OUTPATIENT_CLINIC_OR_DEPARTMENT_OTHER): Payer: Medicare Other | Admitting: Cardiology

## 2021-07-21 ENCOUNTER — Ambulatory Visit (HOSPITAL_BASED_OUTPATIENT_CLINIC_OR_DEPARTMENT_OTHER): Payer: Medicare Other | Admitting: Cardiology

## 2021-08-25 ENCOUNTER — Ambulatory Visit (HOSPITAL_BASED_OUTPATIENT_CLINIC_OR_DEPARTMENT_OTHER): Payer: Medicare Other | Attending: Cardiovascular Disease | Admitting: Cardiology

## 2021-08-29 ENCOUNTER — Telehealth: Payer: Self-pay | Admitting: *Deleted

## 2021-08-29 NOTE — Telephone Encounter (Signed)
Prior Authorization for SPLIT sent to [employer] via web portal.  ? Prior Authorization is not required for the requested services ? ?

## 2021-09-09 ENCOUNTER — Ambulatory Visit: Payer: Medicare Other | Admitting: Cardiovascular Disease

## 2021-09-09 ENCOUNTER — Encounter: Payer: Self-pay | Admitting: Cardiovascular Disease

## 2021-09-09 VITALS — BP 114/68 | HR 57 | Ht 68.0 in | Wt 196.6 lb

## 2021-09-09 DIAGNOSIS — I4819 Other persistent atrial fibrillation: Secondary | ICD-10-CM

## 2021-09-09 DIAGNOSIS — I2510 Atherosclerotic heart disease of native coronary artery without angina pectoris: Secondary | ICD-10-CM

## 2021-09-09 DIAGNOSIS — E7849 Other hyperlipidemia: Secondary | ICD-10-CM | POA: Diagnosis not present

## 2021-09-09 MED ORDER — NITROGLYCERIN 0.4 MG SL SUBL: SUBLINGUAL_TABLET | SUBLINGUAL | 3 refills | Status: DC

## 2021-09-09 NOTE — Progress Notes (Signed)
?Cardiology Office Note:   ? ?Date:  09/09/2021  ? ?ID:  Jared [name], DOB 1955/07/11, MRN [id number] ? ?PCP:  Tisovec, Fransico Him, MD ?  ?[place]  ?Cardiologist: previous Wendi Snipes , now Location manager  ?Advanced Practice Provider:  No care team member to display ?Electrophysiologist:  None  ?[id number]}  ? ?Referring MD: Haywood Pao, MD  ? ?Chief Complaint  ?Patient presents with  ? Coronary Artery Disease  ?   ?  ?  ? ?July 30, 2020: ?  ? ?Jared [name] is a 66 y.o. male with a hx of CAD and CABG.  ?Had several stents placed several years ago .  ?Previous [facility] patient and saw Cecille Rubin for years .   This the first time I have met him ?Hx of atrial fib , is on xarelto  ?No CP or dysapnea. ?Not much exercise  ?Works [employer]  ? ?September 09, 2021: ?Jared [name] is seen today for follow-up visit.  He has a history of coronary artery disease and coronary artery bypass grafting. ?His history of atrial fibrillation.  He is on Xarelto. ? ?Has rare episodes of pain under his L arm .  ?Last for a few seconds  ?Is not similar to his previous angina  ? ?Active all day , not much cardio ?Still works - Associate Professor for [employer]  ? ? ?Past Medical History:  ?Diagnosis Date  ? Chronic back pain   ? Coronary artery disease   ? a. CABGx2 Left internal mammary artery to the LAD and a saphenous vein graft to the first diagonal with apparent intermediate coronary that was felt to be too small to bypass. b. Abnl stress test 05/2017 s/p DES to mid LCx, DES to ramus intermediate, nondominant RCA too small for PCI (med rx), normal EF.  ? Depression   ? ED (erectile dysfunction)   ? Fatigue   ? Hyperlipidemia   ? Hypertension   ? Osteoarthritis   ? "qwhere" (06/24/2017)  ? Sinus bradycardia   ? ? ?Past Surgical History:  ?Procedure Laterality Date  ? CARDIAC CATHETERIZATION  04/18/2008  ? severe stenosis in the left anterior descending and what was described at that point as the first and second diagonal vessels.  The right  coronary artery was nondominant .  The left circumfles had what was felt to be moderate sesions in its midportions  ? CORONARY ANGIOPLASTY WITH STENT PLACEMENT  06/24/2017  ? CORONARY ARTERY BYPASS GRAFT  04/19/2008  ? x2 -- EF of 70% -- left internal mammary artery to the LAD and a saphenous vein graft to the first diagonal with apparent intermediate coronary that was felt to be too small to bypass  ? CORONARY STENT INTERVENTION N/A 06/24/2017  ? Procedure: CORONARY STENT INTERVENTION;  Surgeon: Martinique, Peter M, MD;  Location: [place] [facility];  Service: Cardiovascular;  Laterality: N/A;  ? JOINT REPLACEMENT    ? LEFT HEART CATH AND CORS/GRAFTS ANGIOGRAPHY N/A 06/24/2017  ? Procedure: LEFT HEART CATH AND CORS/GRAFTS ANGIOGRAPHY;  Surgeon: Martinique, Peter M, MD;  Location: [place] [facility];  Service: Cardiovascular;  Laterality: N/A;  ? REVISION TOTAL HIP ARTHROPLASTY Left 01/2014  ? "@ [facility]"  ? TOTAL HIP ARTHROPLASTY Left 12/2005  ? ? ?Current Medications: ?Current Meds  ?Medication Sig  ? acetaminophen (TYLENOL) 500 MG tablet Take 1,000 mg by mouth every 6 (six) hours as needed for mild pain (back).  ? aspirin EC 81 MG tablet Take 1 tablet (  81 mg total) by mouth daily.  ? gabapentin (NEURONTIN) 600 MG tablet Take 1 tablet by mouth as directed.  ? isosorbide mononitrate (IMDUR) 30 MG 24 hr tablet TAKE 1 TABLET(30 MG) BY MOUTH DAILY  ? metFORMIN (GLUCOPHAGE) 500 MG tablet Take 500 mg by mouth daily.  ? metoprolol tartrate (LOPRESSOR) 25 MG tablet TAKE 1 TABLET(25 MG) BY MOUTH TWICE DAILY  ? Omega-3 Fatty Acids (FISH OIL PO) Take 1,400 mcg by mouth daily.  ? rivaroxaban (XARELTO) 20 MG TABS tablet Take 1 tablet (20 mg total) by mouth daily with supper.  ? rosuvastatin (CRESTOR) 5 MG tablet TAKE 1 TABLET(5 MG) BY MOUTH AT BEDTIME  ? sertraline (ZOLOFT) 100 MG tablet Take 100 mg by mouth daily.   ? sildenafil (REVATIO) 20 MG tablet 5 tablet as needed  ? [DISCONTINUED] nitroGLYCERIN (NITROSTAT) 0.4 MG SL tablet TAKE  1 TABLET UNDER THE TONGUE EVERY 5 MINUTES AS NEEDED FOR CHEST PAIN  ?  ? ?Allergies:   Zocor [simvastatin]  ? ?Social History  ? ?Socioeconomic History  ? Marital status: Divorced  ?  Spouse name: Not on file  ? Number of children: Not on file  ? Years of education: Not on file  ? Highest education level: Not on file  ?Occupational History  ? Not on file  ?Tobacco Use  ? Smoking status: Never  ? Smokeless tobacco: Never  ?Vaping Use  ? Vaping Use: Never used  ?Substance and Sexual Activity  ? Alcohol use: No  ? Drug use: No  ? Sexual activity: Yes  ?Other Topics Concern  ? Not on file  ?Social History Narrative  ? Not on file  ? ?Social Determinants of Health  ? ?Financial Resource Strain: Not on file  ?Food Insecurity: Not on file  ?Transportation Needs: Not on file  ?Physical Activity: Not on file  ?Stress: Not on file  ?Social Connections: Not on file  ?  ? ?Family History: ?The patient's family history includes Edema in his mother. ? ?ROS:   ?Please see the history of present illness.    ? All other systems reviewed and are negative. ? ?EKGs/Labs/Other Studies Reviewed:   ? ?The following studies were reviewed today: ? ? ?EKG:    ? ?Recent Labs: ?No results found for requested labs within last 8760 hours.  ?Recent Lipid Panel ?   ?Component Value Date/Time  ? CHOL 162 03/03/2018 1202  ? TRIG 258 (H) 03/03/2018 1202  ? HDL 51 03/03/2018 1202  ? CHOLHDL 3.2 03/03/2018 1202  ? CHOLHDL 3 12/26/2010 0818  ? VLDL 43.4 (H) 12/26/2010 0818  ? LDLCALC 59 03/03/2018 1202  ? LDLDIRECT 95.1 12/26/2010 0818  ? ? ? ?Risk Assessment/Calculations:   ? ?CHA2DS2-VASc Score =    ?This indicates a  % annual risk of stroke. ?The patient's score is based upon: ?  ?  ? ? ?Physical Exam:   ? ?Physical Exam: ?Blood pressure 114/68, pulse (!) 57, height 5' 8"  (1.727 m), weight 196 lb 9.6 oz (89.2 kg), SpO2 97 %. ? ?GEN:  Well nourished, well developed in no acute distress ?HEENT: Normal ?NECK: No JVD; No carotid bruits ?LYMPHATICS: No  lymphadenopathy ?CARDIAC: irreg. Irreg.  ?RESPIRATORY:  Clear to auscultation without rales, wheezing or rhonchi  ?ABDOMEN: Soft, non-tender, non-distended ?MUSCULOSKELETAL:  No edema; No deformity  ?SKIN: Warm and dry ?NEUROLOGIC:  Alert and oriented x 3 ? ? ?ASSESSMENT:   ? ?1. Coronary artery disease involving native coronary artery of native heart without angina  pectoris   ?2. Persistent atrial fibrillation ([place])   ?3. Other hyperlipidemia   ? ? ?PLAN:   ? ?: ? ?CAD :  No angina .    He has an unusual discomfort under his left arm but this pain only last for a matter of seconds.  It does not seem to be brought on by exertion. ? ?Of asked him to exercise on a regular basis.  This will help [place] determine whether he is pains or cardiac related. ? ?2.  Atrial fib:   Has persistent AFib  ?Continue Xarelto. ?He will have follow-up labs with Dr. Osborne Casco. ? ?3.  Hyperlipidemia: This been managed by his primary medical doctor. ? ? ? ? ?   ? ? ?Medication Adjustments/Labs and Tests Ordered: ?Current medicines are reviewed at length with the patient today.  Concerns regarding medicines are outlined above.  ?Orders Placed This Encounter  ?Procedures  ? EKG 12-Lead  ? ?Meds ordered this encounter  ?Medications  ? nitroGLYCERIN (NITROSTAT) 0.4 MG SL tablet  ?  Sig: TAKE 1 TABLET UNDER THE TONGUE EVERY 5 MINUTES AS NEEDED FOR CHEST PAIN  ?  Dispense:  25 tablet  ?  Refill:  3  ? ? ? ?Patient Instructions  ?Medication Instructions:  ?Your physician recommends that you continue on your current medications as directed. Please refer to the Current Medication list given to you today. ? ?*If you need a refill on your cardiac medications before your next appointment, please call your pharmacy* ? ?Lab Work: ?NONE ? ?Testing/Procedures: ?NONE ? ?Follow-Up: ?At [facility], you and your health needs are our priority.  As part of our continuing mission to provide you with exceptional heart care, we have created designated Provider Care  Teams.  These Care Teams include your primary Cardiologist (physician) and Advanced Practice Providers (APPs -  Physician Assistants and Nurse Practitioners) who all work together to provide you with

## 2021-09-09 NOTE — Patient Instructions (Signed)
Medication Instructions:  ?Your physician recommends that you continue on your current medications as directed. Please refer to the Current Medication list given to you today. ? ?*If you need a refill on your cardiac medications before your next appointment, please call your pharmacy* ? ?Lab Work: ?NONE ? ?Testing/Procedures: ?NONE ? ?Follow-Up: ?At [facility], you and your health needs are our priority.  As part of our continuing mission to provide you with exceptional heart care, we have created designated Provider Care Teams.  These Care Teams include your primary Cardiologist (physician) and Advanced Practice Providers (APPs -  Physician Assistants and Nurse Practitioners) who all work together to provide you with the care you need, when you need it. ? ?Your next appointment:   ?1 year(s) ? ?The format for your next appointment:   ?In Person ? ?Provider:   ?Scott Weaver, PA-C  ? ?Other Instructions ? ?Important Information About Sugar ? ? ? ? ?  ?

## 2021-11-18 ENCOUNTER — Other Ambulatory Visit: Payer: Self-pay | Admitting: *Deleted

## 2021-11-18 MED ORDER — METOPROLOL TARTRATE 25 MG PO TABS: ORAL_TABLET | ORAL | 3 refills | Status: DC

## 2021-11-20 ENCOUNTER — Other Ambulatory Visit: Payer: Self-pay | Admitting: Cardiovascular Disease

## 2021-12-02 ENCOUNTER — Other Ambulatory Visit: Payer: Self-pay

## 2021-12-02 DIAGNOSIS — I4819 Other persistent atrial fibrillation: Secondary | ICD-10-CM

## 2021-12-02 MED ORDER — RIVAROXABAN 20 MG PO TABS: 20.0000 mg | ORAL_TABLET | Freq: Every day | ORAL | 5 refills | Status: DC

## 2021-12-02 NOTE — Telephone Encounter (Signed)
Prescription refill request for Xarelto received.  Indication: Afib  Last office visit: 09/09/21 (Nahser)  Weight: 89.2kg Age: 66 Scr: 0.8 (06/14/21 via PCP)  CrCl: 113.83ml/min   Appropriate dose and refill sent to requested pharmacy.

## 2021-12-19 ENCOUNTER — Other Ambulatory Visit: Payer: Self-pay | Admitting: Cardiovascular Disease

## 2022-03-03 ENCOUNTER — Telehealth: Payer: Self-pay | Admitting: *Deleted

## 2022-03-03 ENCOUNTER — Encounter: Payer: Self-pay | Admitting: *Deleted

## 2022-03-03 NOTE — Telephone Encounter (Signed)
Letter has been sent to patient informing them that their sleep study has expired. Patient will need to call and schedule an office visit to re-evaluate the need for a sleep study.    

## 2022-03-12 ENCOUNTER — Encounter: Payer: Self-pay | Admitting: Neurology

## 2022-04-23 NOTE — Progress Notes (Signed)
Assessment/Plan:   1.  Essential Tremor.  - We discussed nature and pathophysiology.  We discussed that this can continue to gradually get worse with time.  We discussed that some medications can worsen this, as can caffeine use.  We discussed medication therapy as well as surgical therapy.  Ultimately, I did not recommend medication therapy as patient was already on Xarelto, which interacts with primidone, and he is already on a beta-blocker.  In addition, tremor is fairly mild, and I think that treating him involves greater risks than benefits.  He does not disagree.  -Reassured him that I saw no evidence of Parkinsons disease today.  2.  Gait instability  -Patient reports some gait instability that is longstanding due to low back pain and issues from lumbar radiculopathy.  He has followed with [facility] spine center in the past.  -He does have some evidence of peripheral neuropathy on examination.  His hemoglobin A1c is 6.7 and admits that he is "prediabetic."  This could certainly play a role in gait, but I do not think that this is a big issue right now.  3.  Follow-up as needed  Subjective:   Jared [name] was seen today in the movement disorders clinic for neurologic consultation at the request of Tisovec, Adelfa Koh, MD.  The consultation is for the evaluation of tremor.  Patient also noted to primary care physician that he is having more difficulty getting food to his mouth and trouble with handwriting and he is sent to rule out Parkinson's disease.   Specific Symptoms:  Tremor: Yes.  , R hand mostly but he is R hand dominant so may just notice it in the right hand.  With writing/eating.  None with rest.  Sx's x 6-8 months.  Others have noted chin tremor.   Family hx of similar:  No. Voice: no change Sleep: chronic insomnia  Vivid Dreams:  Yes.    Acting out dreams:  No. Wet Pillows: No. Postural symptoms:  not as good as it used to be; accident in 1981 where fell and hit  concrete and has had back/neck pain since that time; has had leg length discrepancy since hip replacement.    Falls?  No. Bradykinesia symptoms: difficulty getting out of a chair (attributes to back issues); has trouble with the left leg with ambulation Loss of smell:  No. Loss of taste:  No. Urinary Incontinence:  No. Difficulty Swallowing:  No. Handwriting, micrographia: No. Trouble with ADL's:  No.  Trouble buttoning clothing: No. Depression:  No. Memory changes:  mild trouble with short term things; still able to do finances; still works in [employer] N/V:  No. Lightheaded:  rarely, if sits for long time and then gets up fast  Syncope: No. Diplopia:  No.   Neuroimaging of the brain has not previously been performed in the recent years.    ALLERGIES:   Allergies  Allergen Reactions   Zocor [Simvastatin] Other (See Comments)    myalgias    CURRENT MEDICATIONS:  Current Meds  Medication Sig   acetaminophen (TYLENOL) 500 MG tablet Take 1,000 mg by mouth every 6 (six) hours as needed for mild pain (back).   aspirin EC 81 MG tablet Take 1 tablet (81 mg total) by mouth daily.   gabapentin (NEURONTIN) 600 MG tablet Take 1 tablet by mouth as directed.   isosorbide mononitrate (IMDUR) 30 MG 24 hr tablet TAKE 1 TABLET(30 MG) BY MOUTH DAILY   metFORMIN (GLUCOPHAGE) 500 MG tablet  Take 500 mg by mouth daily.   metoprolol tartrate (LOPRESSOR) 25 MG tablet TAKE 1 TABLET(25 MG) BY MOUTH TWICE DAILY   nitroGLYCERIN (NITROSTAT) 0.4 MG SL tablet PLACE ONE TABLET UNDER THE TONGUE EVERY 5 MINUTES AS NEEDED FOR CHEST PAIN   rivaroxaban (XARELTO) 20 MG TABS tablet Take 1 tablet (20 mg total) by mouth daily with supper.   rosuvastatin (CRESTOR) 5 MG tablet TAKE 1 TABLET(5 MG) BY MOUTH AT BEDTIME   sertraline (ZOLOFT) 100 MG tablet Take 100 mg by mouth daily.    sildenafil (REVATIO) 20 MG tablet 5 tablet as needed     Objective:   VITALS:   Vitals:   04/28/22 0826  BP: 118/76  Pulse: 63   SpO2: 96%  Weight: 194 lb 12.8 oz (88.4 kg)  Height: 5\' 7"  (1.702 m)    GEN:  The patient appears stated age and is in NAD. HEENT:  Normocephalic, atraumatic.  The mucous membranes are moist. The superficial temporal arteries are without ropiness or tenderness. CV:  RRR Lungs:  CTAB Neck/HEME:  There are no carotid bruits bilaterally.  Neurological examination:  Orientation: The patient is alert and oriented x3.  Cranial nerves: There is good facial symmetry. Extraocular muscles are intact. The visual fields are full to confrontational testing. The speech is fluent and clear. Soft palate rises symmetrically and there is no tongue deviation. Hearing is intact to conversational tone. Sensation: Sensation is intact to light and pinprick throughout (facial, trunk, extremities). Vibration is intact at the bilateral big toe but decreased distally. There is no extinction with double simultaneous stimulation. There is no sensory dermatomal level identified. Motor: Strength is 5/5 in the bilateral upper and lower extremities.   Shoulder shrug is equal and symmetric.  There is no pronator drift. Deep tendon reflexes: Deep tendon reflexes are 2/4 at the bilateral biceps, triceps, brachioradialis, 2/4 at the bilateral patella and trace at the bilateral achilles. Plantar responses are downgoing bilaterally.  Movement examination: Tone: There is nl tone in the bilateral upper extremities.  The tone in the lower extremities is nl.  Abnormal movements: there is no rest tremor.  There is chin tremor.  There is no significant postural tremor.  There is mild intention tremor bilaterally.  There is evidence of tremor when he pours water from 1 glass to another, seen on both hands.  He does not really spill the water.  He has no significant trouble with Archimedes spirals. Coordination:  There is no decremation with RAM's, with any form of RAMS, including alternating supination and pronation of the forearm, hand  opening and closing, finger taps, heel taps and toe taps.  Gait and Station: The patient has no difficulty arising out of a deep-seated chair without the use of the hands. The patient's stride length is good.   I have reviewed and interpreted the following labs independently   Chemistry      Component Value Date/Time   NA 137 05/18/2018 1052   K 3.9 05/18/2018 1052   CL 101 05/18/2018 1052   CO2 23 05/18/2018 1052   BUN 16 05/18/2018 1052   CREATININE 0.79 05/18/2018 1052      Component Value Date/Time   CALCIUM 9.0 05/18/2018 1052   ALKPHOS 73 03/03/2018 1202   AST 14 03/03/2018 1202   ALT 14 03/03/2018 1202   BILITOT 0.3 03/03/2018 1202      Lab Results  Component Value Date   TSH 1.690 04/12/2018   Lab Results  Component Value  Date   WBC 6.6 05/18/2018   HGB 13.8 05/18/2018   HCT 40.4 05/18/2018   MCV 90 05/18/2018   PLT 273 05/18/2018   Lab work was done on December 20, 2021 by primary care.  White blood cells 5.8, hemoglobin 15.8, hematocrit 44.5.  Sodium is 142, potassium 4.3, chloride 106,, AST 15, ALT 13, BUN 15, creatinine 0.7, glucose 148.  Hemoglobin A1c 6.7  Total time spent on today's visit was 45 minutes, including both face-to-face time and nonface-to-face time.  Time included that spent on review of records (prior notes available to me/labs/imaging if pertinent), discussing treatment and goals, answering patient's questions and coordinating care.  Cc:  Tisovec, Adelfa Koh, MD

## 2022-04-28 ENCOUNTER — Ambulatory Visit: Payer: Medicare Other | Admitting: Neurology

## 2022-04-28 ENCOUNTER — Encounter: Payer: Self-pay | Admitting: Neurology

## 2022-04-28 VITALS — BP 118/76 | HR 63 | Ht 67.0 in | Wt 194.8 lb

## 2022-04-28 DIAGNOSIS — G250 Essential tremor: Secondary | ICD-10-CM | POA: Diagnosis not present

## 2022-04-28 DIAGNOSIS — E1142 Type 2 diabetes mellitus with diabetic polyneuropathy: Secondary | ICD-10-CM | POA: Diagnosis not present

## 2022-04-28 DIAGNOSIS — M5416 Radiculopathy, lumbar region: Secondary | ICD-10-CM | POA: Diagnosis not present

## 2022-04-28 NOTE — Patient Instructions (Signed)
Essential Tremor ?A tremor is trembling or shaking that a person cannot control. Most tremors affect the hands or arms. Tremors can also affect the head, vocal cords, legs, and other parts of the body. Essential tremor is a tremor without a known cause. Usually, it occurs while a person is trying to perform an action. It tends to get worse gradually as a person ages. ?What are the causes? ?The cause of this condition is not known, but it often runs in families. ?What increases the risk? ?You are more likely to develop this condition if: ?You have a family member with essential tremor. ?You are 40 years of age or older. ?What are the signs or symptoms? ?The main sign of a tremor is a rhythmic shaking of certain parts of your body that is uncontrolled and unintentional. You may: ?Have difficulty eating with a spoon or fork. ?Have difficulty writing. ?Nod your head up and down or side to side. ?Have a quivering voice. ?The shaking may: ?Get worse over time. ?Come and go. ?Be more noticeable on one side of your body. ?Get worse due to stress, tiredness (fatigue), caffeine, and extreme heat or cold. ?How is this diagnosed? ?This condition may be diagnosed based on: ?Your symptoms and medical history. ?A physical exam. ?There is no single test to diagnose an essential tremor. However, your health care provider may order tests to rule out other causes of your condition. These may include: ?Blood and urine tests. ?Imaging studies of your brain, such as a CT scan or MRI. ?How is this treated? ?Treatment for essential tremor depends on the severity of the condition. ?Mild tremors may not need treatment if they do not affect your day-to-day life. ?Severe tremors may need to be treated using one or more of the following options: ?Medicines. ?Injections of a substance called botulinum toxin. ?Procedures such as deep brain stimulation (DBS) implantation or MRI-guided ultrasound treatment. ?Lifestyle changes. ?Occupational or  physical therapy. ?Follow these instructions at home: ?Lifestyle ? ?Do not use any products that contain nicotine or tobacco. These products include cigarettes, chewing tobacco, and vaping devices, such as e-cigarettes. If you need help quitting, ask your health care provider. ?Limit your caffeine intake as told by your health care provider. ?Try to get 8 hours of sleep each night. ?Find ways to manage your stress that fit your lifestyle and personality. Consider trying meditation or yoga. ?Try to anticipate stressful situations and allow extra time to manage them. ?If you are struggling emotionally with the effects of your tremor, consider working with a mental health provider. ?General instructions ?Take over-the-counter and prescription medicines only as told by your health care provider. ?Avoid extreme heat and extreme cold. ?Keep all follow-up visits. This is important. Visits may include physical therapy visits. ?Where to find more information ?National Institute of Neurological Disorders and Stroke: www.ninds.nih.gov ?Contact a health care provider if: ?You experience any changes in the location or intensity of your tremors. ?You start having a tremor after starting a new medicine. ?You have a tremor with other symptoms, such as: ?Numbness. ?Tingling. ?Pain. ?Weakness. ?Your tremor gets worse. ?Your tremor interferes with your daily life. ?You feel down, blue, or sad for at least 2 weeks in a row. ?Worrying about your tremor and what other people think about you interferes with your everyday life functions, including relationships, work, or school. ?Summary ?Essential tremor is a tremor without a known cause. Usually, it occurs when you are trying to perform an action. ?You are more likely   to develop this condition if you have a family member with essential tremor. ?The main sign of a tremor is a rhythmic shaking of certain parts of your body that is uncontrolled and unintentional. ?Treatment for essential  tremor depends on the severity of the condition. ?This information is not intended to replace advice given to you by your health care provider. Make sure you discuss any questions you have with your health care provider. ?Document Revised: 03/01/2021 Document Reviewed: 03/01/2021 ?Elsevier Patient Education ? 2023 Elsevier Inc. ? ?

## 2022-05-05 ENCOUNTER — Other Ambulatory Visit: Payer: Self-pay | Admitting: Cardiovascular Disease

## 2022-05-05 DIAGNOSIS — I4819 Other persistent atrial fibrillation: Secondary | ICD-10-CM

## 2022-05-05 NOTE — Telephone Encounter (Signed)
Xarelto 20mg  refill request received. Pt is 66 years old, weight-88.4kg, Crea-0.80 on 06/14/21 via scanned labs from PCP, last seen by Dr. 06/16/21 on 09/09/2021, Diagnosis-Afib, CrCl-110.5 mL/min; Dose is appropriate based on dosing criteria. Will send in requested refill to pharmacy.

## 2022-09-02 ENCOUNTER — Other Ambulatory Visit: Payer: Self-pay | Admitting: Cardiovascular Disease

## 2022-09-02 DIAGNOSIS — I4819 Other persistent atrial fibrillation: Secondary | ICD-10-CM

## 2022-09-02 NOTE — Telephone Encounter (Addendum)
Xarelto 20mg  refill request received. Pt is 67 years old, weight-88.4kg, Crea-0.80 on 08/04/22 via care Everywhere from PCP, last seen by Dr. Elease Hashimoto on 09/09/21, Diagnosis-Afib, CrCl-110.5 mL/min; Dose is appropriate based on dosing criteria. Will send in refill to requested pharmacy.

## 2022-11-28 ENCOUNTER — Other Ambulatory Visit: Payer: Self-pay

## 2022-11-28 ENCOUNTER — Other Ambulatory Visit: Payer: Self-pay | Admitting: Cardiovascular Disease

## 2022-11-28 MED ORDER — METOPROLOL TARTRATE 25 MG PO TABS: ORAL_TABLET | ORAL | 0 refills | Status: DC

## 2022-12-25 ENCOUNTER — Other Ambulatory Visit: Payer: Self-pay | Admitting: Cardiovascular Disease

## 2022-12-31 ENCOUNTER — Encounter: Payer: Self-pay | Admitting: Physician Assistant

## 2022-12-31 ENCOUNTER — Other Ambulatory Visit: Payer: Self-pay | Admitting: *Deleted

## 2022-12-31 ENCOUNTER — Ambulatory Visit: Payer: Medicare Other | Attending: Physician Assistant | Admitting: Physician Assistant

## 2022-12-31 VITALS — BP 128/76 | HR 89 | Ht 67.0 in | Wt 191.0 lb

## 2022-12-31 DIAGNOSIS — I2510 Atherosclerotic heart disease of native coronary artery without angina pectoris: Secondary | ICD-10-CM | POA: Diagnosis not present

## 2022-12-31 DIAGNOSIS — E782 Mixed hyperlipidemia: Secondary | ICD-10-CM | POA: Diagnosis not present

## 2022-12-31 DIAGNOSIS — I4821 Permanent atrial fibrillation: Secondary | ICD-10-CM | POA: Diagnosis not present

## 2022-12-31 MED ORDER — ISOSORBIDE MONONITRATE ER 30 MG PO TB24: ORAL_TABLET | ORAL | 3 refills | Status: DC

## 2022-12-31 MED ORDER — METOPROLOL TARTRATE 25 MG PO TABS: ORAL_TABLET | ORAL | 3 refills | Status: DC

## 2022-12-31 NOTE — Progress Notes (Signed)
Cardiology Office Note:    Date:  12/31/2022  ID:  Jared [name], DOB 1955-10-17, MRN [id number] PCP: Gaspar Garbe, MD  [facility] Providers Cardiologist:  Kristeen Miss, MD Cardiology APP:  Beatrice Lecher, PA-C       Patient Profile:      Coronary artery disease  S/p CABG 2009 in [facility], SVG-DX) Myoview 05/15/2017: Prior infarct with peri-infarct ischemia, intermediate risk, EF 65 S/p 2.5 x 20 mm DES to the RI; 3.5 x 20 mm DES to the mid LCx in 05/2017 LHC 06/24/2017: RCA proximal 95; LAD ostial 100; RI ostial 90; LCx proximal 95; LIMA-LAD patent, SVG-D1 patent Paroxysmal atrial fibrillation Monitor 04/12/2018: Sinus rhythm, occasional episodes of A-fib/flutter  Hypertension  Hyperlipidemia  Prediabetes Sinus brady Snoring - sleep study scheduled 2021, never done           Discussed the use of AI scribe software for clinical note transcription with the patient, who gave verbal consent to proceed.  History of Present Illness   @ID @ with a history of coronary artery disease and permanent atrial fibrillation, presents for their annual follow-up. He reports no chest pain, shortness of breath, or syncope. He occasionally notices his heart 'beat and then it waits longer to beat again than it normally does' and sometimes it 'goes a little bit faster than normal.' These palpitations are infrequent and have been noticed for years with no recent changes. He denies edema, dyspnea when supine, or any bleeding. He tolerates Xarelto well. He has been on metformin for prediabetes for a while.      ROS: See the HPI    Studies Reviewed:   EKG Interpretation Date/Time:  Wednesday December 31 2022 14:40:13 EDT Ventricular Rate:  69 PR Interval:    QRS Duration:  82 QT Interval:  380 QTC Calculation: 407 R Axis:   99  Text Interpretation: Atrial fibrillation Rightward axis Nonspecific ST and T wave abnormality No change when compared to prior tracing Confirmed by  Tereso Newcomer [phone]) on 12/31/2022 2:59:15 PM    Risk Assessment/Calculations:    CHA2DS2-VASc Score = 4   This indicates a 4.8% annual risk of stroke. The patient's score is based upon: CHF History: 0 HTN History: 1 Diabetes History: 1 Stroke History: 0 Vascular Disease History: 1 Age Score: 1 Gender Score: 0            Physical Exam:   VS:  BP 128/76   Pulse 89   Ht 5\' 7"  (1.702 m)   Wt 191 lb (86.6 kg)   SpO2 95%   BMI 29.91 kg/m    Wt Readings from Last 3 Encounters:  12/31/22 191 lb (86.6 kg)  04/28/22 194 lb 12.8 oz (88.4 kg)  09/09/21 196 lb 9.6 oz (89.2 kg)    GEN: Well nourished, well developed in no acute distress NECK: No JVD, no carotid bruit bilaterally CARDIAC: Irregularly irregular rhythm, no murmurs, rubs, gallops RESPIRATORY:  Clear to auscultation without rales, wheezing or rhonchi  ABDOMEN: Soft EXTREMITIES:  No edema     Assessment and Plan:  CAD (coronary artery disease) Stable without chest pain to suggest angina. -Continue Aspirin 81mg  daily, Imdur 30mg  daily, Metoprolol Tartrate 25mg  twice daily, Nitroglycerin as needed, Crestor 5mg  daily.  Atrial fibrillation (HCC) Permanent, tolerating anticoagulation. Rare palpitations noted, no change in frequency or severity.  Creatinine clearance 122. -Continue Xarelto 20mg  daily, Metoprolol Tartrate 25mg  twice daily. -Consider follow-up monitoring if frequency of palpitations increases.  Hyperlipidemia LDL above  goal (69) last year, goal is less than 55. -Check labs with primary care and consider increasing Crestor to 10mg  daily if LDL remains above 55.        Dispo:  Return in about 1 year (around 12/31/2023) for w/ Dr. Elease Hashimoto, or Tereso Newcomer, PA-C.  Signed, Tereso Newcomer, PA-C

## 2022-12-31 NOTE — Assessment & Plan Note (Signed)
Permanent, tolerating anticoagulation. Rare palpitations noted, no change in frequency or severity.  Creatinine clearance 122. -Continue Xarelto 20mg  daily, Metoprolol Tartrate 25mg  twice daily. -Consider follow-up monitoring if frequency of palpitations increases.

## 2022-12-31 NOTE — Patient Instructions (Signed)
Medication Instructions:   Your physician recommends that you continue on your current medications as directed. Please refer to the Current Medication list given to you today.   *If you need a refill on your cardiac medications before your next appointment, please call your pharmacy*   Lab Work: NONE ORDERED  TODAY    If you have labs (blood work) drawn today and your tests are completely normal, you will receive your results only by: MyChart Message (if you have MyChart) OR A paper copy in the mail If you have any lab test that is abnormal or we need to change your treatment, we will call you to review the results.   Testing/Procedures: NONE ORDERED  TODAY    Follow-Up: At [facility], you and your health needs are our priority.  As part of our continuing mission to provide you with exceptional heart care, we have created designated Provider Care Teams.  These Care Teams include your primary Cardiologist (physician) and Advanced Practice Providers (APPs -  Physician Assistants and Nurse Practitioners) who all work together to provide you with the care you need, when you need it.  We recommend signing up for the patient portal called "MyChart".  Sign up information is provided on this After Visit Summary.  MyChart is used to connect with patients for Virtual Visits (Telemedicine).  Patients are able to view lab/test results, encounter notes, upcoming appointments, etc.  Non-urgent messages can be sent to your provider as well.   To learn more about what you can do with MyChart, go to [email or web address].    Your next appointment:   1 year(s)  Provider:   Kristeen Miss, MD  or Tereso Newcomer, PA-C      Other Instructions

## 2022-12-31 NOTE — Assessment & Plan Note (Signed)
LDL above goal (69) last year, goal is less than 55. -Check labs with primary care and consider increasing Crestor to 10mg  daily if LDL remains above 55.

## 2022-12-31 NOTE — Assessment & Plan Note (Signed)
Stable without chest pain to suggest angina. -Continue Aspirin 81mg  daily, Imdur 30mg  daily, Metoprolol Tartrate 25mg  twice daily, Nitroglycerin as needed, Crestor 5mg  daily.

## 2023-01-10 ENCOUNTER — Other Ambulatory Visit: Payer: Self-pay | Admitting: Cardiovascular Disease

## 2023-04-16 ENCOUNTER — Ambulatory Visit: Payer: Medicare Other | Admitting: Podiatry

## 2023-04-27 ENCOUNTER — Encounter: Payer: Self-pay | Admitting: Podiatry

## 2023-04-27 ENCOUNTER — Ambulatory Visit: Payer: Medicare Other | Admitting: Podiatry

## 2023-04-27 DIAGNOSIS — Z79899 Other long term (current) drug therapy: Secondary | ICD-10-CM

## 2023-04-27 DIAGNOSIS — B351 Tinea unguium: Secondary | ICD-10-CM | POA: Diagnosis not present

## 2023-04-27 MED ORDER — CICLOPIROX 8 % EX SOLN: Freq: Every day | CUTANEOUS | 2 refills | Status: DC

## 2023-04-27 MED ORDER — FLUCONAZOLE 150 MG PO TABS: 150.0000 mg | ORAL_TABLET | ORAL | 0 refills | Status: DC

## 2023-04-27 NOTE — Progress Notes (Unsigned)
Subjective:   Patient ID: Jared [name], male   DOB: 67 y.o.   MRN: [id number]   HPI Chief Complaint  Patient presents with   Nail Problem    RM#13 nail fungus big toes   67 year old male presents the office today with concerns of recurrent fungus to his toes mostly his big toenails.  He previously on Penlac as well as fluconazole he did well with this and the fungus did eventually clear but it has come back.  He occasionally will get discomfort to the toenails.  No open lesions at this time.   Review of Systems  All other systems reviewed and are negative.  Past Medical History:  Diagnosis Date   Atrial fibrillation (HCC)    Chronic back pain    Coronary artery disease    a. CABGx2 Left internal mammary artery to the LAD and a saphenous vein graft to the first diagonal with apparent intermediate coronary that was felt to be too small to bypass. b. Abnl stress test 05/2017 s/p DES to mid LCx, DES to ramus intermediate, nondominant RCA too small for PCI (med rx), normal EF.   Depression    ED (erectile dysfunction)    Fatigue    Hyperlipidemia    Hypertension    Osteoarthritis    "qwhere" (06/24/2017)   Sinus bradycardia     Past Surgical History:  Procedure Laterality Date   CARDIAC CATHETERIZATION  04/18/2008   severe stenosis in the left anterior descending and what was described at that point as the first and second diagonal vessels.  The right coronary artery was nondominant .  The left circumfles had what was felt to be moderate sesions in its midportions   CORONARY ANGIOPLASTY WITH STENT PLACEMENT  06/24/2017   CORONARY ARTERY BYPASS GRAFT  04/19/2008   x2 -- EF of 70% -- left internal mammary artery to the LAD and a saphenous vein graft to the first diagonal with apparent intermediate coronary that was felt to be too small to bypass   CORONARY STENT INTERVENTION N/A 06/24/2017   Procedure: CORONARY STENT INTERVENTION;  Surgeon: Swaziland, Peter M, MD;  Location: [facility] INVASIVE CV  LAB;  Service: Cardiovascular;  Laterality: N/A;   JOINT REPLACEMENT     LEFT HEART CATH AND CORS/GRAFTS ANGIOGRAPHY N/A 06/24/2017   Procedure: LEFT HEART CATH AND CORS/GRAFTS ANGIOGRAPHY;  Surgeon: Swaziland, Peter M, MD;  Location: [facility] INVASIVE CV LAB;  Service: Cardiovascular;  Laterality: N/A;   REVISION TOTAL HIP ARTHROPLASTY Left 01/2014   "@ [facility]"   TOTAL HIP ARTHROPLASTY Left 12/2005     Current Outpatient Medications:    acetaminophen (TYLENOL) 500 MG tablet, Take 1,000 mg by mouth every 6 (six) hours as needed for mild pain (back)., Disp: , Rfl:    aspirin EC 81 MG tablet, Take 1 tablet (81 mg total) by mouth daily., Disp: 90 tablet, Rfl: 3   ciclopirox (PENLAC) 8 % solution, Apply topically at bedtime. Apply over nail and surrounding skin. Apply daily over previous coat. After seven (7) days, may remove with alcohol and continue cycle., Disp: 6.6 mL, Rfl: 2   fluconazole (DIFLUCAN) 150 MG tablet, Take 1 tablet (150 mg total) by mouth once a week., Disp: 12 tablet, Rfl: 0   gabapentin (NEURONTIN) 600 MG tablet, Take 1 tablet by mouth as directed., Disp: , Rfl:    isosorbide mononitrate (IMDUR) 30 MG 24 hr tablet, TAKE 1 TABLET(30 MG) BY MOUTH DAILY., Disp: 90 tablet, Rfl: 3   metFORMIN (GLUCOPHAGE)  500 MG tablet, Take 500 mg by mouth daily., Disp: , Rfl:    metoprolol tartrate (LOPRESSOR) 25 MG tablet, TAKE 1 TABLET(25 MG) BY MOUTH TWICE DAILY, Disp: 180 tablet, Rfl: 3   nitroGLYCERIN (NITROSTAT) 0.4 MG SL tablet, PLACE ONE TABLET UNDER THE TONGUE EVERY 5 MINUTES AS NEEDED FOR CHEST PAIN, Disp: 25 tablet, Rfl: 9   rosuvastatin (CRESTOR) 5 MG tablet, TAKE 1 TABLET(5 MG) BY MOUTH AT BEDTIME, Disp: 90 tablet, Rfl: 1   sertraline (ZOLOFT) 100 MG tablet, Take 100 mg by mouth daily. , Disp: , Rfl:    sildenafil (REVATIO) 20 MG tablet, 5 tablet as needed, Disp: , Rfl:    XARELTO 20 MG TABS tablet, TAKE 1 TABLET(20 MG) BY MOUTH DAILY WITH SUPPER, Disp: 30 tablet, Rfl: 5  Allergies   Allergen Reactions   Zocor [Simvastatin] Other (See Comments)    myalgias          Objective:  Physical Exam  General: AAO x3, NAD  Dermatological: Nails in particular hallux toenails are dystrophic with yellow, brown discoloration.  There is no edema, erythema or signs of infection.  There are no open lesions noted.  Vascular: Dorsalis Pedis artery and Posterior Tibial artery pedal pulses are 2/4 bilateral with immedate capillary fill time.  There is no pain with calf compression, swelling, warmth, erythema.   Neruologic: Grossly intact via light touch bilateral.   Musculoskeletal:  Muscular strength 5/5 in all groups tested bilateral.      Assessment:   Onychomycosis     Plan:  -Treatment options discussed including all alternatives, risks, and complications -Etiology of symptoms were discussed -As he has done well previously with fluconazole and Penlac without any side effects will restart this.  Recently had blood work which was normal and will start fluconazole.  I would like to check a new CBC, LFT in about 6 weeks and order was provided for this.  Continue monitoring side effects.  As a courtesy debrided the nails x 10 without any complications or bleeding.  Vivi Barrack DPM

## 2023-04-27 NOTE — Patient Instructions (Signed)
Fluconazole Tablets What is this medication? FLUCONAZOLE (floo KON na zole) prevents and treats fungal or yeast infections. It belongs to a group of medications called antifungals. It will not prevent or treat colds, the flu, or infections caused by bacteria or viruses. This medicine may be used for other purposes; ask your health care provider or pharmacist if you have questions. COMMON BRAND NAME(S): Diflucan What should I tell my care team before I take this medication? They need to know if you have any of these conditions: Irregular heartbeat or rhythm Kidney disease Liver disease Low levels of potassium in the blood An unusual or allergic reaction to fluconazole, other medications, foods, dyes, or preservatives Pregnant or trying to get pregnant Breastfeeding How should I use this medication? Take this medication by mouth. Take it as directed on the prescription label at the same time every day. You can take it with or without food. If it upsets your stomach, take it with food. Take all of this medication unless your care team tells you to stop it early. Keep taking it even if you think you are better. Talk to your care team about the use of this medication in children. While it may be prescribed for children as young as newborns for selected conditions, precautions do apply. Overdosage: If you think you have taken too much of this medicine contact a poison control center or emergency room at once. NOTE: This medicine is only for you. Do not share this medicine with others. What if I miss a dose? If you miss a dose, take it as soon as you can. If it is almost time for your next dose, take only that dose. Do not take double or extra doses. What may interact with this medication? Do not take this medication with any of the following: Adagrasib Flibanserin Lomitapide Lonafarnib Other medications that cause heart rhythm changes Triazolam This medication may also interact with the  following: Abrocitinib Certain antibiotics, such as rifabutin or rifampin Certain antivirals for HIV or hepatitis Certain medications for blood pressure, heart disease, irregular heartbeat Certain medications for cholesterol, such as atorvastatin, lovastatin, simvastatin Certain medications for depression, such as amitriptyline or nortriptyline Certain medications for diabetes, such as glipizide or glyburide Certain medications for seizures, such as carbamazepine or phenytoin Certain medications that treat or prevent blood clots, such as warfarin Certain opioid medications for pain, such as alfentanil, fentanyl, methadone Cyclophosphamide Cyclosporine Ibrutinib Lemborexant Midazolam NSAIDS, medications for pain and inflammation, such as ibuprofen or naproxen Olaparib Sirolimus Steroid medications, such as prednisone Tacrolimus Theophylline Tofacitinib Tolvaptan Vinblastine Vincristine Vitamin A Voriconazole This list may not describe all possible interactions. Give your health care provider a list of all the medicines, herbs, non-prescription drugs, or dietary supplements you use. Also tell them if you smoke, drink alcohol, or use illegal drugs. Some items may interact with your medicine. What should I watch for while using this medication? Visit your care team for regular checkups. If you are taking this medication for a long time you may need blood work. Tell your care team if your symptoms do not improve. Some fungal infections need many weeks or months of treatment to cure. Alcohol can increase possible damage to your liver. Avoid alcoholic drinks. If you have a vaginal infection, do not have sex until you have finished your treatment. You can wear a sanitary napkin. Do not use tampons. Wear freshly washed cotton, not synthetic, panties. What side effects may I notice from receiving this medication? Side effects that   you should report to your care team as soon as  possible: Allergic reactions--skin rash, itching, hives, swelling of the face, lips, tongue, or throat Heart rhythm changes--fast or irregular heartbeat, dizziness, feeling faint or lightheaded, chest pain, trouble breathing Liver injury--right upper belly pain, loss of appetite, nausea, light-colored stool, dark yellow or brown urine, yellowing skin or eyes, unusual weakness or fatigue Low adrenal gland function--nausea, vomiting, loss of appetite, unusual weakness or fatigue, dizziness Rash, fever, and swollen lymph nodes Redness, blistering, peeling, or loosening of the skin, including inside the mouth Seizures Side effects that usually do not require medical attention (report to your care team if they continue or are bothersome): Change in taste Diarrhea Dizziness Headache Nausea Stomach pain This list may not describe all possible side effects. Call your doctor for medical advice about side effects. You may report side effects to FDA at 1-800-FDA-1088. Where should I keep my medication? Keep out of the reach of children. Store at room temperature below 30 degrees C (86 degrees F). Throw away any medication after the expiration date. NOTE: This sheet is a summary. It may not cover all possible information. If you have questions about this medicine, talk to your doctor, pharmacist, or health care provider.  2024 Elsevier/Gold Standard (2022-07-16 00:00:00)  

## 2023-05-05 ENCOUNTER — Other Ambulatory Visit: Payer: Self-pay | Admitting: Cardiovascular Disease

## 2023-05-05 DIAGNOSIS — I4819 Other persistent atrial fibrillation: Secondary | ICD-10-CM

## 2023-05-05 NOTE — Telephone Encounter (Signed)
Prescription refill request for Xarelto received.  Indication:afib Last office visit:8/24 Weight:86.6  kg Age:67 Scr:0.79  10/24 CrCl:108.1  ml/min  Prescription refilled

## 2023-07-20 NOTE — Progress Notes (Unsigned)
 Assessment/Plan:   1.  Essential Tremor.             - We discussed nature and pathophysiology.  We discussed that this can continue to gradually get worse with time.  We discussed that some medications can worsen this, as can caffeine use.  We discussed medication therapy as well as surgical therapy.  Ultimately, I did not recommend medication therapy as patient was already on Xarelto, which interacts with primidone, and he is already on a beta-blocker.  In addition, tremor is fairly mild, and I think that treating him involves greater risks than benefits.  He does not disagree.             -Reassured him that I saw no evidence of Parkinsons disease today.   2.  Gait instability             -Patient reports some gait instability that is longstanding due to low back pain and issues from lumbar radiculopathy.  He has followed with [facility] spine center in the past.             -He does have some evidence of peripheral neuropathy on examination.  His hemoglobin A1c is 6.7 and admits that he is "prediabetic."  This could certainly play a role in gait, but I do not think that this is a big issue right now.  Subjective:   Jared [name] was seen today in follow up for tremor.  My previous records were reviewed prior to todays visit.  I have not seen the patient since 2023.  At that point in time, tremor was mild.  We did not recommend any medication because he was already on Xarelto which interacted with primidone and he was already on a beta-blocker.  Separately, patient continues to follow-up with pain management center through [facility].  He was last there December 19 for his low back pain.  He had a radiofrequency ablation at [facility] in November.  He is also on gabapentin.  Current prescribed movement disorder medications: ***   PREVIOUS MEDICATIONS: {Parkinson's RX:18200}  ALLERGIES:   Allergies  Allergen Reactions   Zocor [Simvastatin] Other (See Comments)    myalgias    CURRENT  MEDICATIONS:  No outpatient medications have been marked as taking for the 07/22/23 encounter (Appointment) with Tristain Daily, Octaviano Batty, DO.      Objective:    PHYSICAL EXAMINATION:    VITALS:  There were no vitals filed for this visit.  GEN:  The patient appears stated age and is in NAD. HEENT:  Normocephalic, atraumatic.  The mucous membranes are moist. The superficial temporal arteries are without ropiness or tenderness. CV:  RRR Lungs:  CTAB Neck/HEME:  There are no carotid bruits bilaterally.  Neurological examination:  Orientation: The patient is alert and oriented x3. Cranial nerves: There is good facial symmetry. The speech is fluent and clear. Soft palate rises symmetrically and there is no tongue deviation. Hearing is intact to conversational tone. Sensation: Sensation is intact to light touch throughout Motor: Strength is at least antigravity x4.  Movement examination: Tone: There is nl tone in the bilateral upper extremities.  The tone in the lower extremities is nl.  Abnormal movements: there is no rest tremor.  There is chin tremor.  There is no significant postural tremor.  There is mild intention tremor bilaterally.  There is evidence of tremor when he pours water from 1 glass to another, seen on both hands.  He does not really  spill the water.  He has no significant trouble with Archimedes spirals. Coordination:  There is no decremation with RAM's, with any form of RAMS, including alternating supination and pronation of the forearm, hand opening and closing, finger taps, heel taps and toe taps.  Gait and Station: The patient has no difficulty arising out of a deep-seated chair without the use of the hands. The patient's stride length is good.   I have reviewed and interpreted the following labs independently   Chemistry      Component Value Date/Time   NA 137 05/18/2018 1052   K 3.9 05/18/2018 1052   CL 101 05/18/2018 1052   CO2 23 05/18/2018 1052   BUN 16 05/18/2018  1052   CREATININE 0.79 05/18/2018 1052      Component Value Date/Time   CALCIUM 9.0 05/18/2018 1052   ALKPHOS 73 03/03/2018 1202   AST 14 03/03/2018 1202   ALT 14 03/03/2018 1202   BILITOT 0.3 03/03/2018 1202      Lab Results  Component Value Date   WBC 6.6 05/18/2018   HGB 13.8 05/18/2018   HCT 40.4 05/18/2018   MCV 90 05/18/2018   PLT 273 05/18/2018   Lab Results  Component Value Date   TSH 1.690 04/12/2018     Chemistry      Component Value Date/Time   NA 137 05/18/2018 1052   K 3.9 05/18/2018 1052   CL 101 05/18/2018 1052   CO2 23 05/18/2018 1052   BUN 16 05/18/2018 1052   CREATININE 0.79 05/18/2018 1052      Component Value Date/Time   CALCIUM 9.0 05/18/2018 1052   ALKPHOS 73 03/03/2018 1202   AST 14 03/03/2018 1202   ALT 14 03/03/2018 1202   BILITOT 0.3 03/03/2018 1202         Total time spent on today's visit was ***30 minutes, including both face-to-face time and nonface-to-face time.  Time included that spent on review of records (prior notes available to me/labs/imaging if pertinent), discussing treatment and goals, answering patient's questions and coordinating care.  Cc:  Tisovec, Adelfa Koh, MD

## 2023-07-22 ENCOUNTER — Telehealth: Payer: Self-pay

## 2023-07-22 ENCOUNTER — Encounter: Payer: Self-pay | Admitting: Neurology

## 2023-07-22 ENCOUNTER — Ambulatory Visit: Payer: Medicare Other | Admitting: Neurology

## 2023-07-22 VITALS — BP 102/62 | HR 59 | Ht 67.0 in | Wt 192.0 lb

## 2023-07-22 DIAGNOSIS — G250 Essential tremor: Secondary | ICD-10-CM | POA: Diagnosis not present

## 2023-07-22 DIAGNOSIS — M5416 Radiculopathy, lumbar region: Secondary | ICD-10-CM

## 2023-07-22 NOTE — Telephone Encounter (Signed)
 Marchelle Folks CMA at Dr Deneen Harts office called no answer left a detailed voice mail that Dr Tat is asking if they will consider changing pt metoprolol to propranolol to help with tremors left phone number [phone] to please call us back to let us know,

## 2023-07-23 ENCOUNTER — Telehealth: Payer: Self-pay | Admitting: Cardiovascular Disease

## 2023-07-23 NOTE — Telephone Encounter (Signed)
 Heather called in from [facility]. She states Dr. Arbutus Leas would like to change pt Metoprolol to Propranolol to help with his tremors, please advise if this is okay and she asked if Dr. Elease Hashimoto can send it in.

## 2023-07-23 NOTE — Telephone Encounter (Signed)
 Dr Harrison Mons office called to see if they will consider changing pt metoprolol to propranolol to help with tremors. They will call back to let us know if Dr Melburn Popper will change the medication

## 2023-07-23 NOTE — Telephone Encounter (Signed)
 Left voicemail to return call to office

## 2023-07-24 MED ORDER — PROPRANOLOL HCL ER 80 MG PO CP24: 80.0000 mg | ORAL_CAPSULE | Freq: Every day | ORAL | 3 refills | Status: AC

## 2023-07-24 NOTE — Telephone Encounter (Signed)
 [name], Jared Ping, MD  You1 hour ago (3:46 PM)   Im ok switching him to Propranolol  He may take Propranolol 20 mg QID Or Inderal LA 80 mg a day  The Inderal LA may be expensive  PN   Returned call to heather at [facility], but no answer and left detailed message explaining that we are fine with trying propranolol. Called and spoke with patient about it. He would like to try the ER formula and will call us if it's too expensive. I explained that we can change to the IR, but he'd have to take it four times daily. He will let us know.

## 2023-10-10 ENCOUNTER — Other Ambulatory Visit: Payer: Self-pay | Admitting: Cardiovascular Disease

## 2023-10-12 ENCOUNTER — Other Ambulatory Visit: Payer: Self-pay

## 2023-10-12 MED ORDER — ISOSORBIDE MONONITRATE ER 30 MG PO TB24: ORAL_TABLET | ORAL | 0 refills | Status: DC

## 2023-11-12 ENCOUNTER — Other Ambulatory Visit: Payer: Self-pay | Admitting: Cardiovascular Disease

## 2023-11-12 DIAGNOSIS — I4819 Other persistent atrial fibrillation: Secondary | ICD-10-CM

## 2023-11-12 NOTE — Telephone Encounter (Signed)
 Prescription refill request for Xarelto  received.   Indication: afib  Last office visit: Jared [name] 12/31/2022 Weight: 87.1 kg  Age: 68 yo  Scr: 0.79, 03/19/2023 CrCl: 109 ml/min   Refill sent.

## 2024-01-11 ENCOUNTER — Other Ambulatory Visit: Payer: Self-pay | Admitting: Physician Assistant

## 2024-04-01 ENCOUNTER — Ambulatory Visit: Admitting: Student in an Organized Health Care Education/Training Program

## 2024-04-05 ENCOUNTER — Other Ambulatory Visit: Payer: Self-pay | Admitting: Physician Assistant

## 2024-04-06 ENCOUNTER — Ambulatory Visit
Attending: Student in an Organized Health Care Education/Training Program | Admitting: Student in an Organized Health Care Education/Training Program

## 2024-04-06 VITALS — BP 117/74 | HR 69 | Ht 67.0 in | Wt 188.0 lb

## 2024-04-06 DIAGNOSIS — I10 Essential (primary) hypertension: Secondary | ICD-10-CM | POA: Diagnosis not present

## 2024-04-06 DIAGNOSIS — I4821 Permanent atrial fibrillation: Secondary | ICD-10-CM

## 2024-04-06 DIAGNOSIS — I2510 Atherosclerotic heart disease of native coronary artery without angina pectoris: Secondary | ICD-10-CM

## 2024-04-06 DIAGNOSIS — E782 Mixed hyperlipidemia: Secondary | ICD-10-CM

## 2024-04-06 NOTE — Patient Instructions (Signed)
 Medication Instructions:  Stop aspirin  81 mg *If you need a refill on your cardiac medications before your next appointment, please call your pharmacy*  Lab Work: BMET, LIPID-TODAY If you have labs (blood work) drawn today and your tests are completely normal, you will receive your results only by: MyChart Message (if you have MyChart) OR A paper copy in the mail If you have any lab test that is abnormal or we need to change your treatment, we will call you to review the results.  Follow-Up: At [facility], you and your health needs are our priority.  As part of our continuing mission to provide you with exceptional heart care, our providers are all part of one team.  This team includes your primary Cardiologist (physician) and Advanced Practice Providers or APPs (Physician Assistants and Nurse Practitioners) who all work together to provide you with the care you need, when you need it.  Your next appointment:   1 year(s)  Provider:   Dr Floretta

## 2024-04-06 NOTE — Progress Notes (Signed)
 Cardiology Office Note:   Date:  04/06/2024  ID:  Jared [name], DOB 02-19-1956, MRN [id number] PCP: Vernadine Charlie ORN, MD  [facility] Providers Cardiologist: Jared [name] Cardiology APP:  Jared Glendia DASEN, PA-C { Chief Complaint:  Chief Complaint  Patient presents with   Follow-up      History of Present Illness:   Jared [name] is a 68 y.o. male with a PMH of CAD s/p 2V CABG (LIMA-LAD, SVG-D1) & PCI (2019), HTN, HLD, permanent AF (on xarelto ) who presents for follow up.   Patient presents for follow-up and has no complaints.  He denies chest pain, SOB, PND, orthopnea, swelling, syncope and presyncope.  He is taking all of his medications as prescribed without side effect.  He recently retired from [employer] work 3 months ago.  He admits to not eating as healthy as he should or exercising much.  No other concerns.  Past Medical History:  Diagnosis Date   Atrial fibrillation (HCC)    Chronic back pain    Coronary artery disease    a. CABGx2 Left internal mammary artery to the LAD and a saphenous vein graft to the first diagonal with apparent intermediate coronary that was felt to be too small to bypass. b. Abnl stress test 05/2017 s/p DES to mid LCx, DES to ramus intermediate, nondominant RCA too small for PCI (med rx), normal EF.   Depression    ED (erectile dysfunction)    Fatigue    Hyperlipidemia    Hypertension    Osteoarthritis    qwhere (06/24/2017)   Sinus bradycardia      Studies Reviewed:    EKG:  EKG Interpretation Date/Time:  Wednesday April 06 2024 15:03:40 EST Ventricular Rate:  69 PR Interval:    QRS Duration:  84 QT Interval:  408 QTC Calculation: 437 R Axis:   59  Text Interpretation: Atrial fibrillation When compared with ECG of 31-Dec-2022 14:40, No significant change was found Confirmed by Jared [name] [phone]) on 04/06/2024 3:14:53 PM     Cardiac Studies & Procedures    ______________________________________________________________________________________________ CARDIAC CATHETERIZATION  CARDIAC CATHETERIZATION 06/24/2017  Conclusion  Prox RCA lesion is 95% stenosed.  Ost LAD to Prox LAD lesion is 100% stenosed.  Ost Ramus to Ramus lesion is 90% stenosed.  A drug-eluting stent was successfully placed using a STENT SYNERGY DES 2.5X20.  Post intervention, there is a 0% residual stenosis.  Prox Cx lesion is 95% stenosed.  Post intervention, there is a 0% residual stenosis.  A drug-eluting stent was successfully placed using a STENT SYNERGY DES 3.5X20.  SVG graft was visualized by angiography and is normal in caliber.  The graft exhibits no disease.  LIMA graft was visualized by angiography and is normal in caliber.  The graft exhibits no disease.  The left ventricular systolic function is normal.  LV end diastolic pressure is normal.  The left ventricular ejection fraction is 55-65% by visual estimate.  1. Severe 3 vessel obstructive CAD 2. Patent LIMA to the LAD 3. Patent SVG to the first diagonal 4. Normal LV function 5. Normal LVEDP 6. Successful PCI of the mid LCx- dominant vessel with DES 7. Successful PCI of the proximal ramus intermediate with DES  Plan: DAPT for one year. Anticipate DC in am. The nondominant RCA is too small for PCI and should be managed medically.  Findings Coronary Findings Diagnostic  Dominance: Left  Left Anterior Descending Ost LAD to Prox LAD lesion is 100% stenosed.  Ramus Intermedius  Ost Ramus to Ramus lesion is 90% stenosed.  Left Circumflex Prox Cx lesion is 95% stenosed.  Right Coronary Artery Vessel is small. Prox RCA lesion is 95% stenosed.  Saphenous Graft To 1st Diag SVG graft was visualized by angiography and is normal in caliber. The graft exhibits no disease.  LIMA LIMA Graft To Mid LAD LIMA graft was visualized by angiography and is normal in caliber. The graft exhibits no  disease.  Intervention  Ost Ramus to Ramus lesion Stent Lesion crossed with guidewire using a WIRE ASAHI PROWATER 180CM. Pre-stent angioplasty was performed using a BALLOON SAPPHIRE 2.5X12. A drug-eluting stent was successfully placed using a STENT SYNERGY DES 2.5X20. Stent strut is well apposed. Post-stent angioplasty was performed using a BALLOON SAPPHIRE [place] 2.5X15. Maximum pressure:  16 atm. Post-Intervention Lesion Assessment The intervention was successful. Pre-interventional TIMI flow is 3. Post-intervention TIMI flow is 3. No complications occurred at this lesion. There is a 0% residual stenosis post intervention.  Prox Cx lesion Stent Lesion crossed with guidewire using a WIRE ASAHI PROWATER 180CM. Pre-stent angioplasty was performed using a BALLOON SAPPHIRE 2.5X12. A drug-eluting stent was successfully placed using a STENT SYNERGY DES 3.5X20. Stent strut is well apposed. Post-stent angioplasty was performed using a BALLOON SAPPHIRE  [id number]. Maximum pressure:  16 atm. Post-Intervention Lesion Assessment The intervention was successful. Pre-interventional TIMI flow is 3. Post-intervention TIMI flow is 3. No complications occurred at this lesion. There is a 0% residual stenosis post intervention.   STRESS TESTS  MYOCARDIAL PERFUSION IMAGING 05/15/2017  Interpretation Summary  Nuclear stress EF: 65%. There is septal wall akinesis (post CABG septal wall changes).  There was no ST segment deviation noted during stress.  Defect 1: There is a large defect of moderate severity present in the basal inferior, mid inferior and apical inferior location.  Findings consistent with prior myocardial infarction with peri-infarct ischemia.  This is an intermediate risk study.  [name], MD      MONITORS  CARDIAC EVENT MONITOR 04/12/2018  Narrative  Sinus rhythm with occasional episodes of Atrial fib / Atrial flutter.  His HR is typically slow when he has atrial flutter /  atrial fib       ______________________________________________________________________________________________      Risk Assessment/Calculations:    CHA2DS2-VASc Score = 4   This indicates a 4.8% annual risk of stroke. The patient's score is based upon: CHF History: 0 HTN History: 1 Diabetes History: 1 Stroke History: 0 Vascular Disease History: 1 Age Score: 1 Gender Score: 0             Physical Exam:     VS:  BP 117/74   Pulse 69   Ht 5' 7 (1.702 m)   Wt 188 lb (85.3 kg)   SpO2 94%   BMI 29.44 kg/m      Wt Readings from Last 3 Encounters:  07/22/23 192 lb (87.1 kg)  12/31/22 191 lb (86.6 kg)  04/28/22 194 lb 12.8 oz (88.4 kg)     GEN: Well nourished, well developed, in no acute distress NECK: No JVD; No carotid bruits CARDIAC: RRR, no murmurs, rubs, gallops, well-healed sternotomy scar c/d/i RESPIRATORY:  Clear to auscultation without rales, wheezing or rhonchi  ABDOMEN: Soft, non-tender, non-distended, normal bowel sounds EXTREMITIES:  Warm and well perfused, no edema; No deformity, 2+ radial pulses PSYCH: Normal mood and affect   Assessment & Plan Coronary artery disease involving native coronary artery of native heart without angina pectoris - Patient is doing  great without any symptoms. -I encouraged him to focus on eating a heart healthy diet and increasing his exercise even if it is just walking 30 minutes a day. -He does not need to be on both an aspirin  and a DOAC, so we will stop his baby aspirin . Stop aspirin  Continue Xarelto  Permanent atrial fibrillation (HCC) - Asymptomatic.  No changes. Continue Xarelto  Continue propranolol  (on for tremors) Mixed hyperlipidemia - Due for lipid panel check. Lipid panel Primary hypertension - Blood pressure is well-controlled on Imdur . BMP Continue Imdur           This note was written with the assistance of a dictation microphone or AI dictation software. Please excuse any typos or grammatical  errors.   Signed, [name] Archer, MD 04/06/2024 1:36 PM    [facility]

## 2024-04-06 NOTE — Assessment & Plan Note (Signed)
-   Due for lipid panel check. Lipid panel

## 2024-04-06 NOTE — Assessment & Plan Note (Addendum)
-   Blood pressure is well-controlled on Imdur . BMP Continue Imdur

## 2024-04-06 NOTE — Assessment & Plan Note (Addendum)
-   Patient is doing great without any symptoms. -I encouraged him to focus on eating a heart healthy diet and increasing his exercise even if it is just walking 30 minutes a day. -He does not need to be on both an aspirin  and a DOAC, so we will stop his baby aspirin . Stop aspirin  Continue Xarelto

## 2024-04-06 NOTE — Assessment & Plan Note (Addendum)
-   Asymptomatic.  No changes. Continue Xarelto  Continue propranolol  (on for tremors)

## 2024-04-07 LAB — LIPID PANEL
Chol/HDL Ratio: 3.7 ratio (ref 0.0–5.0)
Cholesterol, Total: 164 mg/dL (ref 100–199)
HDL: 44 mg/dL (ref >39)
LDL Chol Calc (NIH): 76 mg/dL (ref 0–99)
Triglycerides: 274 mg/dL — ABNORMAL HIGH (ref 0–149)
VLDL Cholesterol Cal: 44 mg/dL — ABNORMAL HIGH (ref 5–40)

## 2024-04-07 LAB — BASIC METABOLIC PANEL WITH GFR
BUN/Creatinine Ratio: 17 (ref 10–24)
BUN: 12 mg/dL (ref 8–27)
CO2: 25 mmol/L (ref 20–29)
Calcium: 9.2 mg/dL (ref 8.6–10.2)
Chloride: 103 mmol/L (ref 96–106)
Creatinine, Ser: 0.72 mg/dL — ABNORMAL LOW (ref 0.76–1.27)
Glucose: 143 mg/dL — ABNORMAL HIGH (ref 70–99)
Potassium: 4.1 mmol/L (ref 3.5–5.2)
Sodium: 142 mmol/L (ref 134–144)
eGFR: 98 mL/min/1.73 (ref >59)

## 2024-04-07 MED ORDER — ISOSORBIDE MONONITRATE ER 30 MG PO TB24: ORAL_TABLET | ORAL | 3 refills | Status: AC

## 2024-04-08 ENCOUNTER — Ambulatory Visit: Payer: Self-pay | Admitting: Student in an Organized Health Care Education/Training Program

## 2024-04-19 NOTE — Progress Notes (Signed)
 Attempted to call patient about his lipid results. No answer left a vm to call back.

## 2024-04-28 NOTE — Progress Notes (Signed)
 Attempted to call patient about his lipid results. No answer left a vm to call back.

## 2024-05-02 MED ORDER — ROSUVASTATIN CALCIUM 20 MG PO TABS: ORAL_TABLET | ORAL | 3 refills | Status: AC

## 2024-05-23 ENCOUNTER — Other Ambulatory Visit: Payer: Self-pay

## 2024-05-23 ENCOUNTER — Other Ambulatory Visit: Payer: Self-pay | Admitting: Podiatry

## 2024-05-23 DIAGNOSIS — I4819 Other persistent atrial fibrillation: Secondary | ICD-10-CM

## 2024-05-23 MED ORDER — RIVAROXABAN 20 MG PO TABS: 20.0000 mg | ORAL_TABLET | Freq: Every day | ORAL | 5 refills | Status: AC

## 2024-05-23 NOTE — Telephone Encounter (Signed)
 Prescription refill request for Xarelto  received.  Indication: afib  Last office visit:Jared [name], 04/06/2024 Weight: 85.3 kg  Age: 68 yo  Scr: 0.72, 04/06/2024 CrCl: 115 ml/min   Refill sent.

## 2024-07-25 ENCOUNTER — Ambulatory Visit: Admitting: Podiatry
